# Patient Record
Sex: Male | Born: 1955
Health system: Southern US, Community
[De-identification: ages and names within clinical notes are randomized; demographics above are authoritative.]

## PROBLEM LIST (undated history)

## (undated) DIAGNOSIS — I10 Essential (primary) hypertension: Secondary | ICD-10-CM

## (undated) DIAGNOSIS — G4730 Sleep apnea, unspecified: Secondary | ICD-10-CM

## (undated) DIAGNOSIS — I447 Left bundle-branch block, unspecified: Secondary | ICD-10-CM

## (undated) DIAGNOSIS — I420 Dilated cardiomyopathy: Secondary | ICD-10-CM

## (undated) DIAGNOSIS — E7800 Pure hypercholesterolemia, unspecified: Secondary | ICD-10-CM

## (undated) HISTORY — DX: Pure hypercholesterolemia, unspecified: E78.00

## (undated) HISTORY — DX: Sleep apnea, unspecified: G47.30

## (undated) HISTORY — DX: Left bundle-branch block, unspecified: I44.7

## (undated) HISTORY — DX: Essential (primary) hypertension: I10

## (undated) HISTORY — DX: Dilated cardiomyopathy: I42.0

## (undated) HISTORY — PX: BACK SURGERY: SHX140

---

## 1998-02-21 ENCOUNTER — Encounter: Admission: RE | Admit: 1998-02-21 | Discharge: 1998-02-21 | Payer: Self-pay | Admitting: *Deleted

## 2003-08-04 ENCOUNTER — Emergency Department (HOSPITAL_COMMUNITY): Admission: AD | Admit: 2003-08-04 | Discharge: 2003-08-04 | Payer: Self-pay | Admitting: Family Medicine

## 2003-10-04 ENCOUNTER — Encounter: Admission: RE | Admit: 2003-10-04 | Discharge: 2003-10-04 | Payer: Self-pay | Admitting: Otolaryngology

## 2003-11-04 ENCOUNTER — Ambulatory Visit (HOSPITAL_BASED_OUTPATIENT_CLINIC_OR_DEPARTMENT_OTHER): Admission: RE | Admit: 2003-11-04 | Discharge: 2003-11-04 | Payer: Self-pay | Admitting: Otolaryngology

## 2003-12-25 ENCOUNTER — Ambulatory Visit (HOSPITAL_BASED_OUTPATIENT_CLINIC_OR_DEPARTMENT_OTHER): Admission: RE | Admit: 2003-12-25 | Discharge: 2003-12-25 | Payer: Self-pay | Admitting: Otolaryngology

## 2005-07-21 ENCOUNTER — Emergency Department (HOSPITAL_COMMUNITY): Admission: EM | Admit: 2005-07-21 | Discharge: 2005-07-21 | Payer: Self-pay | Admitting: Emergency Medicine

## 2011-01-14 ENCOUNTER — Encounter: Payer: Self-pay | Admitting: *Deleted

## 2011-01-14 DIAGNOSIS — I447 Left bundle-branch block, unspecified: Secondary | ICD-10-CM

## 2011-01-14 DIAGNOSIS — I10 Essential (primary) hypertension: Secondary | ICD-10-CM | POA: Insufficient documentation

## 2011-01-14 DIAGNOSIS — E119 Type 2 diabetes mellitus without complications: Secondary | ICD-10-CM | POA: Insufficient documentation

## 2011-01-14 DIAGNOSIS — E7800 Pure hypercholesterolemia, unspecified: Secondary | ICD-10-CM

## 2011-01-23 ENCOUNTER — Encounter: Payer: Self-pay | Admitting: Cardiology

## 2011-01-23 ENCOUNTER — Ambulatory Visit (INDEPENDENT_AMBULATORY_CARE_PROVIDER_SITE_OTHER): Payer: BC Managed Care – PPO | Admitting: Cardiology

## 2011-01-23 DIAGNOSIS — E7800 Pure hypercholesterolemia, unspecified: Secondary | ICD-10-CM

## 2011-01-23 DIAGNOSIS — I420 Dilated cardiomyopathy: Secondary | ICD-10-CM | POA: Insufficient documentation

## 2011-01-23 DIAGNOSIS — I10 Essential (primary) hypertension: Secondary | ICD-10-CM

## 2011-01-23 DIAGNOSIS — I428 Other cardiomyopathies: Secondary | ICD-10-CM

## 2011-01-23 NOTE — Assessment & Plan Note (Signed)
Blood pressure control is excellent. 

## 2011-01-23 NOTE — Progress Notes (Signed)
Kateri Plummer Date of Birth: 07-06-1955   History of Present Illness: Mr. Banfill is seen today for yearly followup. He he reports that he has done very well this past year. He denies any symptoms of palpitations, dizziness, chest pain, shortness of breath. He reports his blood sugars have been doing well. He remains active.  Current Outpatient Prescriptions on File Prior to Visit  Medication Sig Dispense Refill  . aspirin 325 MG tablet Take 81 mg by mouth daily.       . carvedilol (COREG) 6.25 MG tablet Take 6.25 mg by mouth 2 (two) times daily with a meal.        . fenofibrate (TRICOR) 145 MG tablet Take 145 mg by mouth daily.        Marland Kitchen losartan (COZAAR) 50 MG tablet Take 50 mg by mouth daily.        . multivitamin (THERAGRAN) per tablet Take 1 tablet by mouth daily.        . Omega-3 Fatty Acids (FISH OIL PO) Take 1 capsule by mouth daily.        . Sildenafil Citrate (VIAGRA PO) Take by mouth as needed.        Marland Kitchen SIMVASTATIN PO Take 1 tablet by mouth daily.          No Known Allergies  Past Medical History  Diagnosis Date  . Hypertension   . Diabetes mellitus     type 2  . Nonischemic dilated cardiomyopathy     Est. EF of 40-45%  . LBBB (left bundle branch block)   . Hypercholesterolemia   . Sleep apnea     Past Surgical History  Procedure Date  . Back surgery     History  Smoking status  . Never Smoker   Smokeless tobacco  . Never Used    History  Alcohol Use No    Family History  Problem Relation Age of Onset  . Diabetes Father   . Hypertension Father     ?  Marland Kitchen Cancer Father     kidney  . Hypertension Mother   . Stroke Mother   . Transient ischemic attack Mother     multiple  . Diabetes Brother   . Anorexia nervosa Sister   . Depression Sister   . Hyperlipidemia Sister     Review of Systems: The review of systems is positive for Cataracts.  He is scheduled for surgery in the near future.All other systems were reviewed and are negative.  Physical  Exam: BP 110/60  Pulse 68  Ht 6\' 2"  (1.88 m)  Wt 207 lb (93.895 kg)  BMI 26.58 kg/m2 The patient is alert and oriented x 3.  The mood and affect are normal.  The skin is warm and dry.  Color is normal.  The HEENT exam reveals that the sclera are nonicteric.  The mucous membranes are moist.  The carotids are 2+ without bruits.  There is no thyromegaly.  There is no JVD.  The lungs are clear.  The chest wall is non tender.  The heart exam reveals a regular rate with a normal S1 and S2.  There are no murmurs, gallops, or rubs.  The PMI is not displaced.   Abdominal exam reveals good bowel sounds.  There is no guarding or rebound.  There is no hepatosplenomegaly or tenderness.  There are no masses.  Exam of the legs reveal no clubbing, cyanosis, or edema.  The legs are without rashes.  The distal pulses are  intact.  Cranial nerves II - XII are intact.  Motor and sensory functions are intact.  The gait is normal.  LABORATORY DATA:   Assessment / Plan:

## 2011-01-23 NOTE — Patient Instructions (Signed)
Continue your current medications.  I will see you again in 1 year.   

## 2011-01-23 NOTE — Assessment & Plan Note (Signed)
Continue therapy with simvastatin, fenofibrate, and fish oil.

## 2011-01-23 NOTE — Assessment & Plan Note (Signed)
He is asymptomatic. He is on appropriate therapy with an ARB and carvedilol. He has no evidence of volume overload. We will continue with his current therapy and followup again in one year.

## 2013-02-08 ENCOUNTER — Ambulatory Visit (INDEPENDENT_AMBULATORY_CARE_PROVIDER_SITE_OTHER): Payer: BC Managed Care – PPO

## 2013-02-08 VITALS — Temp 98.0°F

## 2013-02-08 DIAGNOSIS — Z23 Encounter for immunization: Secondary | ICD-10-CM

## 2015-08-20 DIAGNOSIS — R21 Rash and other nonspecific skin eruption: Secondary | ICD-10-CM | POA: Diagnosis not present

## 2015-08-20 DIAGNOSIS — J069 Acute upper respiratory infection, unspecified: Secondary | ICD-10-CM | POA: Diagnosis not present

## 2015-08-20 DIAGNOSIS — S30861A Insect bite (nonvenomous) of abdominal wall, initial encounter: Secondary | ICD-10-CM | POA: Diagnosis not present

## 2015-08-20 DIAGNOSIS — S30860A Insect bite (nonvenomous) of lower back and pelvis, initial encounter: Secondary | ICD-10-CM | POA: Diagnosis not present

## 2015-09-25 DIAGNOSIS — X32XXXA Exposure to sunlight, initial encounter: Secondary | ICD-10-CM | POA: Diagnosis not present

## 2015-09-25 DIAGNOSIS — Z1283 Encounter for screening for malignant neoplasm of skin: Secondary | ICD-10-CM | POA: Diagnosis not present

## 2015-09-25 DIAGNOSIS — L570 Actinic keratosis: Secondary | ICD-10-CM | POA: Diagnosis not present

## 2015-09-25 DIAGNOSIS — T7840XA Allergy, unspecified, initial encounter: Secondary | ICD-10-CM | POA: Diagnosis not present

## 2016-02-19 DIAGNOSIS — E119 Type 2 diabetes mellitus without complications: Secondary | ICD-10-CM | POA: Diagnosis not present

## 2016-02-19 DIAGNOSIS — H5203 Hypermetropia, bilateral: Secondary | ICD-10-CM | POA: Diagnosis not present

## 2016-02-26 DIAGNOSIS — Z23 Encounter for immunization: Secondary | ICD-10-CM | POA: Diagnosis not present

## 2016-04-08 DIAGNOSIS — L570 Actinic keratosis: Secondary | ICD-10-CM | POA: Diagnosis not present

## 2016-04-08 DIAGNOSIS — X32XXXD Exposure to sunlight, subsequent encounter: Secondary | ICD-10-CM | POA: Diagnosis not present

## 2016-06-19 DIAGNOSIS — N529 Male erectile dysfunction, unspecified: Secondary | ICD-10-CM | POA: Diagnosis not present

## 2016-06-19 DIAGNOSIS — E1165 Type 2 diabetes mellitus with hyperglycemia: Secondary | ICD-10-CM | POA: Diagnosis not present

## 2016-06-19 DIAGNOSIS — E782 Mixed hyperlipidemia: Secondary | ICD-10-CM | POA: Diagnosis not present

## 2016-06-19 DIAGNOSIS — Z0000 Encounter for general adult medical examination without abnormal findings: Secondary | ICD-10-CM | POA: Diagnosis not present

## 2016-06-19 DIAGNOSIS — I10 Essential (primary) hypertension: Secondary | ICD-10-CM | POA: Diagnosis not present

## 2016-06-19 DIAGNOSIS — E119 Type 2 diabetes mellitus without complications: Secondary | ICD-10-CM | POA: Diagnosis not present

## 2017-02-22 DIAGNOSIS — E119 Type 2 diabetes mellitus without complications: Secondary | ICD-10-CM | POA: Diagnosis not present

## 2017-02-22 DIAGNOSIS — H25811 Combined forms of age-related cataract, right eye: Secondary | ICD-10-CM | POA: Diagnosis not present

## 2017-02-26 DIAGNOSIS — M25511 Pain in right shoulder: Secondary | ICD-10-CM | POA: Diagnosis not present

## 2017-02-26 DIAGNOSIS — Z23 Encounter for immunization: Secondary | ICD-10-CM | POA: Diagnosis not present

## 2017-02-26 DIAGNOSIS — M545 Low back pain: Secondary | ICD-10-CM | POA: Diagnosis not present

## 2017-02-26 DIAGNOSIS — Z8051 Family history of malignant neoplasm of kidney: Secondary | ICD-10-CM | POA: Diagnosis not present

## 2017-03-26 ENCOUNTER — Other Ambulatory Visit: Payer: Self-pay | Admitting: Family Medicine

## 2017-03-26 DIAGNOSIS — Z8051 Family history of malignant neoplasm of kidney: Secondary | ICD-10-CM

## 2017-04-02 ENCOUNTER — Ambulatory Visit
Admission: RE | Admit: 2017-04-02 | Discharge: 2017-04-02 | Disposition: A | Discharge status: Home / Self Care | Payer: BLUE CROSS/BLUE SHIELD | Source: Ambulatory Visit | Attending: Family Medicine | Admitting: Family Medicine

## 2017-04-02 DIAGNOSIS — Z8051 Family history of malignant neoplasm of kidney: Secondary | ICD-10-CM

## 2017-04-02 DIAGNOSIS — K7689 Other specified diseases of liver: Secondary | ICD-10-CM | POA: Diagnosis not present

## 2017-04-02 MED ORDER — IOPAMIDOL (ISOVUE-300) INJECTION 61%
100.0000 mL | Freq: Once | INTRAVENOUS | Status: AC | PRN
  Administered 2017-04-02: 100 mL via INTRAVENOUS

## 2017-04-10 ENCOUNTER — Other Ambulatory Visit: Payer: Self-pay | Admitting: Family Medicine

## 2017-04-10 DIAGNOSIS — Z8051 Family history of malignant neoplasm of kidney: Secondary | ICD-10-CM

## 2017-04-28 ENCOUNTER — Ambulatory Visit
Admission: RE | Admit: 2017-04-28 | Discharge: 2017-04-28 | Disposition: A | Discharge status: Home / Self Care | Payer: BLUE CROSS/BLUE SHIELD | Source: Ambulatory Visit | Attending: Family Medicine | Admitting: Family Medicine

## 2017-04-28 DIAGNOSIS — N2889 Other specified disorders of kidney and ureter: Secondary | ICD-10-CM | POA: Diagnosis not present

## 2017-04-28 DIAGNOSIS — Z8051 Family history of malignant neoplasm of kidney: Secondary | ICD-10-CM

## 2017-04-28 MED ORDER — GADOBENATE DIMEGLUMINE 529 MG/ML IV SOLN
18.0000 mL | Freq: Once | INTRAVENOUS | Status: AC | PRN
  Administered 2017-04-28: 18 mL via INTRAVENOUS

## 2017-05-11 DIAGNOSIS — H25041 Posterior subcapsular polar age-related cataract, right eye: Secondary | ICD-10-CM | POA: Diagnosis not present

## 2017-05-11 DIAGNOSIS — H25011 Cortical age-related cataract, right eye: Secondary | ICD-10-CM | POA: Diagnosis not present

## 2017-05-11 DIAGNOSIS — H18413 Arcus senilis, bilateral: Secondary | ICD-10-CM | POA: Diagnosis not present

## 2017-05-11 DIAGNOSIS — H2511 Age-related nuclear cataract, right eye: Secondary | ICD-10-CM | POA: Diagnosis not present

## 2017-06-24 DIAGNOSIS — E782 Mixed hyperlipidemia: Secondary | ICD-10-CM | POA: Diagnosis not present

## 2017-06-24 DIAGNOSIS — I10 Essential (primary) hypertension: Secondary | ICD-10-CM | POA: Diagnosis not present

## 2017-06-24 DIAGNOSIS — Z125 Encounter for screening for malignant neoplasm of prostate: Secondary | ICD-10-CM | POA: Diagnosis not present

## 2017-06-24 DIAGNOSIS — E119 Type 2 diabetes mellitus without complications: Secondary | ICD-10-CM | POA: Diagnosis not present

## 2017-06-24 DIAGNOSIS — Z0000 Encounter for general adult medical examination without abnormal findings: Secondary | ICD-10-CM | POA: Diagnosis not present

## 2017-06-28 DIAGNOSIS — H2511 Age-related nuclear cataract, right eye: Secondary | ICD-10-CM | POA: Diagnosis not present

## 2017-06-28 DIAGNOSIS — H25811 Combined forms of age-related cataract, right eye: Secondary | ICD-10-CM | POA: Diagnosis not present

## 2018-03-29 DIAGNOSIS — Z23 Encounter for immunization: Secondary | ICD-10-CM | POA: Diagnosis not present

## 2018-07-05 DIAGNOSIS — E1122 Type 2 diabetes mellitus with diabetic chronic kidney disease: Secondary | ICD-10-CM | POA: Diagnosis not present

## 2018-07-05 DIAGNOSIS — E782 Mixed hyperlipidemia: Secondary | ICD-10-CM | POA: Diagnosis not present

## 2018-07-05 DIAGNOSIS — N183 Chronic kidney disease, stage 3 (moderate): Secondary | ICD-10-CM | POA: Diagnosis not present

## 2018-07-05 DIAGNOSIS — Z125 Encounter for screening for malignant neoplasm of prostate: Secondary | ICD-10-CM | POA: Diagnosis not present

## 2018-07-05 DIAGNOSIS — Z0000 Encounter for general adult medical examination without abnormal findings: Secondary | ICD-10-CM | POA: Diagnosis not present

## 2018-07-05 DIAGNOSIS — I10 Essential (primary) hypertension: Secondary | ICD-10-CM | POA: Diagnosis not present

## 2018-09-29 ENCOUNTER — Other Ambulatory Visit: Payer: Self-pay

## 2018-09-29 ENCOUNTER — Ambulatory Visit: Payer: PPO | Admitting: Podiatry

## 2018-09-29 ENCOUNTER — Encounter: Payer: Self-pay | Admitting: Podiatry

## 2018-09-29 VITALS — BP 104/53 | Temp 98.4°F

## 2018-09-29 DIAGNOSIS — L600 Ingrowing nail: Secondary | ICD-10-CM | POA: Diagnosis not present

## 2018-09-29 MED ORDER — NEOMYCIN-POLYMYXIN-HC 3.5-10000-1 OT SOLN: OTIC | 0 refills | Status: DC

## 2018-09-29 MED ORDER — NEOMYCIN-POLYMYXIN-HC 3.5-10000-1 OT SOLN
OTIC | 1 refills | Status: DC
Start: 2018-09-29 — End: 2019-08-10

## 2018-09-29 NOTE — Patient Instructions (Signed)

## 2018-09-30 NOTE — Progress Notes (Signed)
Subjective:   Patient ID: Jeffery Stone, male   DOB: 63 y.o.   MRN: [id number]   HPI Patient presents with chronic ingrown toenail deformities of both feet stating that been there for years and he is tried to trim them and soak them without relief.  Patient points to 2 borders on the left and the medial border on the right and states they are increasingly sore and he would like them taken care of permanently.  Patient does not smoke and does like to be active   Review of Systems  All other systems reviewed and are negative.       Objective:  Physical Exam Vitals signs and nursing note reviewed.  Constitutional:      Appearance: He is well-developed.  Pulmonary:     Effort: Pulmonary effort is normal.  Musculoskeletal: Normal range of motion.  Skin:    General: Skin is warm.  Neurological:     Mental Status: He is alert.     Neurovascular status intact muscle strength is adequate range of motion within normal limits.  Patient is found to have incurvated left hallux medial lateral borders that are sore when palpated and on the right hallux medial border it is painful when pressed.  There is no drainage noted in light redness but no indication of infective process just pain with structural deformity of the nailbeds.  Patient does have good digital perfusion and is well oriented x3     Assessment:  Chronic ingrown toenail deformity hallux bilateral medial lateral left medial border on the right with pain     Plan:  A H&P conditions reviewed and recommended removal of the nail borders.  I explained the procedure risk and patient signed consent form after review.  Today I infiltrated each hallux 60 mg like Marcaine mixture sterile prep applied and using sterile instrumentation I remove the medial border the right hallux and the medial lateral border the left hallux exposed matrix and applied phenol 3 applications 30 seconds followed by out dressing.  Gave instructions on soaks and drops and  instructed on leaving dressings on 24 hours but removing them early if throbbing not to occur and encouraged to call with questions concerns signed visit

## 2018-10-13 DIAGNOSIS — H26492 Other secondary cataract, left eye: Secondary | ICD-10-CM | POA: Diagnosis not present

## 2018-10-13 DIAGNOSIS — H1045 Other chronic allergic conjunctivitis: Secondary | ICD-10-CM | POA: Diagnosis not present

## 2018-10-13 DIAGNOSIS — E119 Type 2 diabetes mellitus without complications: Secondary | ICD-10-CM | POA: Diagnosis not present

## 2019-02-16 ENCOUNTER — Other Ambulatory Visit: Payer: Self-pay | Admitting: Family Medicine

## 2019-02-16 DIAGNOSIS — R1909 Other intra-abdominal and pelvic swelling, mass and lump: Secondary | ICD-10-CM | POA: Diagnosis not present

## 2019-02-16 DIAGNOSIS — Z23 Encounter for immunization: Secondary | ICD-10-CM | POA: Diagnosis not present

## 2019-02-17 ENCOUNTER — Ambulatory Visit (INDEPENDENT_AMBULATORY_CARE_PROVIDER_SITE_OTHER): Payer: PPO

## 2019-02-17 ENCOUNTER — Other Ambulatory Visit: Payer: Self-pay

## 2019-02-17 DIAGNOSIS — R1909 Other intra-abdominal and pelvic swelling, mass and lump: Secondary | ICD-10-CM

## 2019-03-07 DIAGNOSIS — R590 Localized enlarged lymph nodes: Secondary | ICD-10-CM | POA: Diagnosis not present

## 2019-03-14 DIAGNOSIS — S80869A Insect bite (nonvenomous), unspecified lower leg, initial encounter: Secondary | ICD-10-CM | POA: Diagnosis not present

## 2019-03-14 DIAGNOSIS — W57XXXA Bitten or stung by nonvenomous insect and other nonvenomous arthropods, initial encounter: Secondary | ICD-10-CM | POA: Diagnosis not present

## 2019-03-17 DIAGNOSIS — E782 Mixed hyperlipidemia: Secondary | ICD-10-CM | POA: Diagnosis not present

## 2019-03-17 DIAGNOSIS — I10 Essential (primary) hypertension: Secondary | ICD-10-CM | POA: Diagnosis not present

## 2019-03-17 DIAGNOSIS — N183 Chronic kidney disease, stage 3 unspecified: Secondary | ICD-10-CM | POA: Diagnosis not present

## 2019-03-17 DIAGNOSIS — E1122 Type 2 diabetes mellitus with diabetic chronic kidney disease: Secondary | ICD-10-CM | POA: Diagnosis not present

## 2019-04-07 DIAGNOSIS — E1122 Type 2 diabetes mellitus with diabetic chronic kidney disease: Secondary | ICD-10-CM | POA: Diagnosis not present

## 2019-04-07 DIAGNOSIS — I10 Essential (primary) hypertension: Secondary | ICD-10-CM | POA: Diagnosis not present

## 2019-04-07 DIAGNOSIS — E782 Mixed hyperlipidemia: Secondary | ICD-10-CM | POA: Diagnosis not present

## 2019-06-12 DIAGNOSIS — I10 Essential (primary) hypertension: Secondary | ICD-10-CM | POA: Diagnosis not present

## 2019-06-12 DIAGNOSIS — E782 Mixed hyperlipidemia: Secondary | ICD-10-CM | POA: Diagnosis not present

## 2019-06-12 DIAGNOSIS — E1122 Type 2 diabetes mellitus with diabetic chronic kidney disease: Secondary | ICD-10-CM | POA: Diagnosis not present

## 2019-07-06 ENCOUNTER — Emergency Department (HOSPITAL_COMMUNITY)
Admission: EM | Admit: 2019-07-06 | Discharge: 2019-07-06 | Disposition: A | Discharge status: Home / Self Care | Payer: PPO | Attending: Emergency Medicine | Admitting: Emergency Medicine

## 2019-07-06 ENCOUNTER — Other Ambulatory Visit: Payer: Self-pay

## 2019-07-06 ENCOUNTER — Encounter (HOSPITAL_COMMUNITY): Payer: Self-pay | Admitting: Pediatrics

## 2019-07-06 ENCOUNTER — Emergency Department (HOSPITAL_COMMUNITY): Payer: PPO

## 2019-07-06 DIAGNOSIS — M7918 Myalgia, other site: Secondary | ICD-10-CM | POA: Diagnosis not present

## 2019-07-06 DIAGNOSIS — M79651 Pain in right thigh: Secondary | ICD-10-CM | POA: Diagnosis not present

## 2019-07-06 DIAGNOSIS — Z125 Encounter for screening for malignant neoplasm of prostate: Secondary | ICD-10-CM | POA: Diagnosis not present

## 2019-07-06 DIAGNOSIS — Z0000 Encounter for general adult medical examination without abnormal findings: Secondary | ICD-10-CM | POA: Diagnosis not present

## 2019-07-06 DIAGNOSIS — Z79899 Other long term (current) drug therapy: Secondary | ICD-10-CM | POA: Insufficient documentation

## 2019-07-06 DIAGNOSIS — Z8679 Personal history of other diseases of the circulatory system: Secondary | ICD-10-CM | POA: Diagnosis not present

## 2019-07-06 DIAGNOSIS — G4733 Obstructive sleep apnea (adult) (pediatric): Secondary | ICD-10-CM | POA: Diagnosis not present

## 2019-07-06 DIAGNOSIS — E782 Mixed hyperlipidemia: Secondary | ICD-10-CM | POA: Diagnosis not present

## 2019-07-06 DIAGNOSIS — E119 Type 2 diabetes mellitus without complications: Secondary | ICD-10-CM | POA: Diagnosis not present

## 2019-07-06 DIAGNOSIS — I10 Essential (primary) hypertension: Secondary | ICD-10-CM | POA: Diagnosis not present

## 2019-07-06 DIAGNOSIS — M79652 Pain in left thigh: Secondary | ICD-10-CM | POA: Insufficient documentation

## 2019-07-06 DIAGNOSIS — R0781 Pleurodynia: Secondary | ICD-10-CM | POA: Diagnosis not present

## 2019-07-06 DIAGNOSIS — M791 Myalgia, unspecified site: Secondary | ICD-10-CM

## 2019-07-06 DIAGNOSIS — I447 Left bundle-branch block, unspecified: Secondary | ICD-10-CM | POA: Diagnosis not present

## 2019-07-06 DIAGNOSIS — E1122 Type 2 diabetes mellitus with diabetic chronic kidney disease: Secondary | ICD-10-CM | POA: Diagnosis not present

## 2019-07-06 DIAGNOSIS — Z7982 Long term (current) use of aspirin: Secondary | ICD-10-CM | POA: Insufficient documentation

## 2019-07-06 DIAGNOSIS — N183 Chronic kidney disease, stage 3 unspecified: Secondary | ICD-10-CM | POA: Diagnosis not present

## 2019-07-06 DIAGNOSIS — R0789 Other chest pain: Secondary | ICD-10-CM | POA: Diagnosis not present

## 2019-07-06 DIAGNOSIS — I700 Atherosclerosis of aorta: Secondary | ICD-10-CM | POA: Diagnosis not present

## 2019-07-06 DIAGNOSIS — R350 Frequency of micturition: Secondary | ICD-10-CM | POA: Diagnosis not present

## 2019-07-06 LAB — TROPONIN I (HIGH SENSITIVITY)
Troponin I (High Sensitivity): 3 ng/L (ref <18)
Troponin I (High Sensitivity): 3 ng/L (ref <18)

## 2019-07-06 LAB — BASIC METABOLIC PANEL
Anion gap: 10 (ref 5–15)
BUN: 33 mg/dL — ABNORMAL HIGH (ref 8–23)
CO2: 25 mmol/L (ref 22–32)
Calcium: 9.3 mg/dL (ref 8.9–10.3)
Chloride: 106 mmol/L (ref 98–111)
Creatinine, Ser: 1.47 mg/dL — ABNORMAL HIGH (ref 0.61–1.24)
GFR calc Af Amer: 57 mL/min — ABNORMAL LOW (ref >60)
GFR calc non Af Amer: 49 mL/min — ABNORMAL LOW (ref >60)
Glucose, Bld: 89 mg/dL (ref 70–99)
Potassium: 4.2 mmol/L (ref 3.5–5.1)
Sodium: 141 mmol/L (ref 135–145)

## 2019-07-06 LAB — CBC
HCT: 35.4 % — ABNORMAL LOW (ref 39.0–52.0)
Hemoglobin: 12 g/dL — ABNORMAL LOW (ref 13.0–17.0)
MCH: 31.2 pg (ref 26.0–34.0)
MCHC: 33.9 g/dL (ref 30.0–36.0)
MCV: 91.9 fL (ref 80.0–100.0)
Platelets: 250 10*3/uL (ref 150–400)
RBC: 3.85 MIL/uL — ABNORMAL LOW (ref 4.22–5.81)
RDW: 13.4 % (ref 11.5–15.5)
WBC: 6.5 10*3/uL (ref 4.0–10.5)
nRBC: 0 % (ref 0.0–0.2)

## 2019-07-06 MED ORDER — SODIUM CHLORIDE 0.9% FLUSH: 3.0000 mL | Freq: Once | INTRAVENOUS | Status: DC

## 2019-07-06 NOTE — ED Provider Notes (Signed)
[name] Provider Note   CSN: [id number] Arrival date & time: 07/06/19  1814     History Chief Complaint  Patient presents with  . Chest Pain  . Leg Pain    Jeffery Stone is a 64 y.o. male.  64 yo M with a chief complaints of myalgias.  Worse to his upper thighs and his hamstrings bilaterally.  Going on for about a year or so worsening over the past few months.  Has seen his family doctor.  They were concerned this was a statin induced myopathy and have decreased his statin.  Has continued to have symptoms.  Was seen back in the doctor's office and noticed that his CPK was elevated and was sent to the ED for evaluation.  States he is also had some left torso pain.  Feels like a spasm-like in his legs.  Denies trauma to the area.  Worse with twisting and turning.  The history is provided by the patient.  Chest Pain Pain location:  L lateral chest Pain quality: aching   Pain radiates to:  Does not radiate Pain severity:  Moderate Onset quality:  Gradual Duration:  2 days Timing:  Constant Progression:  Worsening Chronicity:  New Relieved by:  Nothing Worsened by:  Movement and certain positions Ineffective treatments:  None tried Associated symptoms: no abdominal pain, no fever, no headache, no palpitations, no shortness of breath and no vomiting   Leg Pain Associated symptoms: no fever        Past Medical History:  Diagnosis Date  . Diabetes mellitus    type 2  . Hypercholesterolemia   . Hypertension   . LBBB (left bundle branch block)   . Nonischemic dilated cardiomyopathy (HCC)    Est. EF of 40-45%  . Sleep apnea     Patient Active Problem List   Diagnosis Date Noted  . Nonischemic dilated cardiomyopathy ([place])   . Left bundle branch block 01/14/2011  . Hypertension 01/14/2011  . Hypercholesterolemia 01/14/2011  . Diabetes mellitus 01/14/2011    Past Surgical History:  Procedure Laterality Date  . BACK SURGERY          Family History  Problem Relation Age of Onset  . Diabetes Father   . Hypertension Father        ?  Marland Kitchen Cancer Father        kidney  . Hypertension Mother   . Stroke Mother   . Transient ischemic attack Mother        multiple  . Diabetes Brother   . Anorexia nervosa Sister   . Depression Sister   . Hyperlipidemia Sister     Social History   Tobacco Use  . Smoking status: Never Smoker  . Smokeless tobacco: Never Used  Substance Use Topics  . Alcohol use: No  . Drug use: No    Home Medications Prior to Admission medications   Medication Sig Start Date End Date Taking? Authorizing Provider  aspirin 325 MG tablet Take 81 mg by mouth daily.     [provider]  Bayer Microlet Lancets lancets USE TO TEST BLOOD SUGAR ONCE DAILY E11.9 05/23/18   [provider]  carvedilol (COREG) 6.25 MG tablet Take 6.25 mg by mouth 2 (two) times daily with a meal.      [provider]  fenofibrate (TRICOR) 145 MG tablet Take 145 mg by mouth daily.      [provider]  losartan (COZAAR) 100 MG tablet Take  50 mg by mouth daily. 05/28/18   [provider]  losartan (COZAAR) 50 MG tablet Take 50 mg by mouth daily.      [provider]  multivitamin [facility]) per tablet Take 1 tablet by mouth daily.      [provider]  neomycin-polymyxin-hydrocortisone (CORTISPORIN) OTIC solution Apply 1-2 drops to toe after soaking BID 09/29/18   Wallene Huh, DPM  neomycin-polymyxin-hydrocortisone (CORTISPORIN) OTIC solution Apply 1-2 drops to toe after soaking twice a day 09/29/18   Wallene Huh, DPM  Omega-3 Fatty Acids (FISH OIL PO) Take 1 capsule by mouth daily.      [provider]  sildenafil (VIAGRA) 100 MG tablet TAKE 1/2 TABLET ONCE DAILY IF NEEDED 05/28/18   [provider]  Sildenafil Citrate (VIAGRA PO) Take by mouth as needed.      [provider]  simvastatin (ZOCOR) 40 MG tablet Take 40 mg by mouth  daily. 07/01/18   [provider]  SIMVASTATIN PO Take 1 tablet by mouth daily.      [provider]    Allergies    Patient has no known allergies.  Review of Systems   Review of Systems  Constitutional: Negative for chills and fever.  HENT: Negative for congestion and facial swelling.   Eyes: Negative for discharge and visual disturbance.  Respiratory: Negative for shortness of breath.   Cardiovascular: Positive for chest pain. Negative for palpitations.  Gastrointestinal: Negative for abdominal pain, diarrhea and vomiting.  Musculoskeletal: Positive for arthralgias and myalgias.  Skin: Negative for color change and rash.  Neurological: Negative for tremors, syncope and headaches.  Psychiatric/Behavioral: Negative for confusion and dysphoric mood.    Physical Exam Updated Vital Signs BP 109/71   Pulse 67   Temp 97.8 F (36.6 C) (Oral)   Resp 17   Ht 6\' 2"  (1.88 m)   Wt 89.4 kg   SpO2 99%   BMI 25.29 kg/m   Physical Exam Vitals and nursing note reviewed.  Constitutional:      Appearance: He is well-developed.  HENT:     Head: Normocephalic and atraumatic.  Eyes:     Pupils: Pupils are equal, round, and reactive to light.  Neck:     Vascular: No JVD.  Cardiovascular:     Rate and Rhythm: Normal rate and regular rhythm.     Heart sounds: No murmur. No friction rub. No gallop.   Pulmonary:     Effort: No respiratory distress.     Breath sounds: No wheezing.  Abdominal:     General: There is no distension.     Tenderness: There is no guarding or rebound.  Musculoskeletal:        General: Normal range of motion.     Cervical back: Normal range of motion and neck supple.     Comments: Mild tenderness about the hamstrings bilaterally.  There is a small nodule along the left hamstring as it is approaching the pelvis.  Question lymph node.  No erythema nontender.  Skin:    Coloration: Skin is not pale.     Findings: No rash.  Neurological:      Mental Status: He is alert and oriented to person, place, and time.  Psychiatric:        Behavior: Behavior normal.     ED Results / Procedures / Treatments   Labs (all labs ordered are listed, but only abnormal results are displayed) Labs Reviewed  BASIC METABOLIC PANEL - Abnormal; Notable  for the following components:      Result Value   BUN 33 (*)    Creatinine, Ser 1.47 (*)    GFR calc non Af Amer 49 (*)    GFR calc Af Amer 57 (*)    All other components within normal limits  CBC - Abnormal; Notable for the following components:   RBC 3.85 (*)    Hemoglobin 12.0 (*)    HCT 35.4 (*)    All other components within normal limits  TROPONIN I (HIGH SENSITIVITY)  TROPONIN I (HIGH SENSITIVITY)    EKG EKG Interpretation  Date/Time:  Thursday July 06 2019 18:18:40 EST Ventricular Rate:  82 PR Interval:  140 QRS Duration: 146 QT Interval:  426 QTC Calculation: 497 R Axis:   46 Text Interpretation: Normal sinus rhythm Left bundle branch block Abnormal ECG No significant change since last tracing Confirmed by Deno Etienne [phone]) on 07/06/2019 8:40:16 PM   Radiology DG Chest 2 View  Result Date: 07/06/2019 CLINICAL DATA:  LEFT lower rib pain for several weeks. Recent COVID vaccination. EXAM: CHEST - 2 VIEW COMPARISON:  Chest x-ray dated 08/24/2013. FINDINGS: Heart size and mediastinal contours are within normal limits. Lungs are clear. No pleural effusion or pneumothorax is seen. Osseous structures about the chest are unremarkable. IMPRESSION: No active cardiopulmonary disease. No evidence of pneumonia. Electronically Signed   By: Franki Cabot M.D.   On: 07/06/2019 19:33    Procedures Procedures (including critical care time)  Medications Ordered in ED Medications  sodium chloride flush (NS) 0.9 % injection 3 mL (has no administration in time range)    ED Course  I have reviewed the triage vital signs and the nursing notes.  Pertinent labs & imaging results that were  available during my care of the patient were reviewed by me and considered in my medical decision making (see chart for details).    MDM Rules/Calculators/A&P                      64 yo M here with a chief complaints of myalgias.  Seen in the family doctor's office today and there is some concern for an elevated CPK.  The level that was reported is not high enough that I would consider to be rhabdomyolysis.  He is a well-appearing and active individual.  His delta troponin here is negative making that inconsistent with ACS. D/c home.   11:05 PM:  I have discussed the diagnosis/risks/treatment options with the patient and believe the pt to be eligible for discharge home to follow-up with PCP. We also discussed returning to the ED immediately if new or worsening sx occur. We discussed the sx which are most concerning (e.g., sudden worsening pain, fever, inability to tolerate by mouth) that necessitate immediate return. Medications administered to the patient during their visit and any new prescriptions provided to the patient are listed below.  Medications given during this visit Medications  sodium chloride flush (NS) 0.9 % injection 3 mL (has no administration in time range)     The patient appears reasonably screen and/or stabilized for discharge and I doubt any other medical condition or other [facility] requiring further screening, evaluation, or treatment in the ED at this time prior to discharge.    Final Clinical Impression(s) / ED Diagnoses Final diagnoses:  Myalgia    Rx / DC Orders ED Discharge Orders    None       Deno Etienne, DO 07/06/19 2306

## 2019-07-06 NOTE — ED Notes (Signed)
Called pt in lobby for vitals check , no response.

## 2019-07-06 NOTE — Discharge Instructions (Signed)
Based on the work-up here does not appear that you are having a heart attack.  Please follow-up with your family doctor.  Please asked them about your renal function that was found here today.

## 2019-07-06 NOTE — ED Triage Notes (Signed)
Came in POV; c/o rib and leg pain. Patient reported was sent for abnormal CPK value of 484. Pt endorsed history of left bundle branch block.

## 2019-07-19 DIAGNOSIS — R748 Abnormal levels of other serum enzymes: Secondary | ICD-10-CM | POA: Diagnosis not present

## 2019-07-26 DIAGNOSIS — D225 Melanocytic nevi of trunk: Secondary | ICD-10-CM | POA: Diagnosis not present

## 2019-07-26 DIAGNOSIS — Z1283 Encounter for screening for malignant neoplasm of skin: Secondary | ICD-10-CM | POA: Diagnosis not present

## 2019-08-07 DIAGNOSIS — E7800 Pure hypercholesterolemia, unspecified: Secondary | ICD-10-CM | POA: Diagnosis not present

## 2019-08-07 DIAGNOSIS — R748 Abnormal levels of other serum enzymes: Secondary | ICD-10-CM | POA: Diagnosis not present

## 2019-08-08 NOTE — Progress Notes (Signed)
Cardiology Office Note   Date:  08/10/2019   ID:  Jeffery Stone, DOB Sep 05, 1955, MRN [id number]  PCP:  Leighton Ruff, MD  Cardiologist:   Yisell Sprunger Martinique, MD   Chief Complaint  Patient presents with  . Chest Pain      History of Present Illness: Jeffery Stone is a 64 y.o. male who is seen at the request of Dr Drema Dallas for evaluation of cardiomyopathy and chest pain. He has a history of DM type 2, HLD, HTN and LBBB. He has a history of OSA. Aortic atherosclerosis noted on CT. He was last seen by me in 2012. Apparently has cardiomyopathy with EF 40-45%. Myoview perfusion study in 2006 showed no ischemia.   Recently he was seen for a routine physical. Noted some symptoms of chest pain around his ribs R>L. Noted more when doing yard work. On physical labs showed an Elevated CPK to 484. He was sent to the ED where troponin levels were normal x 2. Ecg showed his chronic LBBB. He states his CPK went down to 216 but then back up again. Simvastatin held. He comes of a knot in the area of his left upper thigh that comes and goes. Multiple scans of the area have been normal.     Past Medical History:  Diagnosis Date  . Diabetes mellitus    type 2  . Hypercholesterolemia   . Hypertension   . LBBB (left bundle branch block)   . Nonischemic dilated cardiomyopathy (HCC)    Est. EF of 40-45%  . Sleep apnea     Past Surgical History:  Procedure Laterality Date  . BACK SURGERY       Current Outpatient Medications  Medication Sig Dispense Refill  . aspirin EC 81 MG tablet Take 81 mg by mouth daily.    [name] Microlet Lancets lancets USE TO TEST BLOOD SUGAR ONCE DAILY E11.9    . carvedilol (COREG) 6.25 MG tablet Take 6.25 mg by mouth 2 (two) times daily with a meal.      . fenofibrate (TRICOR) 145 MG tablet Take 145 mg by mouth daily.      Marland Kitchen losartan (COZAAR) 100 MG tablet Take 50 mg by mouth daily.    Marland Kitchen losartan (COZAAR) 50 MG tablet Take 50 mg by mouth daily.      . multivitamin  (THERAGRAN) per tablet Take 1 tablet by mouth daily.      . Omega-3 Fatty Acids (FISH OIL PO) Take 1 capsule by mouth daily.      . sildenafil (VIAGRA) 100 MG tablet TAKE 1/2 TABLET ONCE DAILY IF NEEDED     No current facility-administered medications for this visit.    Allergies:   Patient has no known allergies.    Social History:  The patient  reports that he has never smoked. He has never used smokeless tobacco. He reports that he does not drink alcohol or use drugs.   Family History:  The patient's family history includes Anorexia nervosa in his sister; Cancer in his father; Depression in his sister; Diabetes in his brother and father; Hyperlipidemia in his sister; Hypertension in his father and mother; Stroke in his mother; Transient ischemic attack in his mother.    ROS:  Please see the history of present illness.   Otherwise, review of systems are positive for none.   All other systems are reviewed and negative.    PHYSICAL EXAM: VS:  BP 102/62   Pulse 77   Ht 6\' 2"  (  1.88 m)   Wt 189 lb (85.7 kg)   SpO2 99%   BMI 24.27 kg/m  , BMI Body mass index is 24.27 kg/m. GEN: Well nourished, well developed, in no acute distress  HEENT: normal  Neck: no JVD, carotid bruits, or masses Cardiac: RRR; no murmurs, rubs, or gallops,no edema  Respiratory:  clear to auscultation bilaterally, normal work of breathing GI: soft, nontender, nondistended, + BS MS: no deformity or atrophy  Skin: warm and dry, no rash Neuro:  Strength and sensation are intact Psych: euthymic mood, full affect   EKG:  EKG is not ordered today. The ekg ordered 07/06/19 demonstrates NSR with LBBB. I have personally reviewed and interpreted this study.    Recent Labs: 07/06/2019: BUN 33; Creatinine, Ser 1.47; Hemoglobin 12.0; Platelets 250; Potassium 4.2; Sodium 141    Lipid Panel No results found for: CHOL, TRIG, HDL, CHOLHDL, VLDL, LDLCALC, LDLDIRECT   Dated 07/06/19: cholesterol 136, triglycerides 81,  HDL 32, LDL 71. A1c 6%. Creatinine 1.2.. Hgb 12. Otherwise CMET and CBC normal.    Wt Readings from Last 3 Encounters:  08/10/19 189 lb (85.7 kg)  07/06/19 197 lb (89.4 kg)  01/23/11 207 lb (93.9 kg)      Other studies Reviewed: Additional studies/ records that were reviewed today include: see above   ASSESSMENT AND PLAN:  1.  Atypical chest pain. Symptoms sound more musculoskeletal but patient does have multiple cardiac risk factors including DM, HLD, HTN and aortic atherosclerosis. I have recommended checking a Lexiscan myoview study.  2. History of nonischemic CM with EF 40-45%. Asymptomatic. On ARB and Coreg. I suspect this is at least partly related to dyssynchrony with his LBBB. Will update Echo 3. Elevated CPK. Not cardiac. Suspect related to statin. If does not improve with cessation of statin may need to consider further muscle assessment 4. LBBB 5. DM type 2 6. HTN 7. HLD   Current medicines are reviewed at length with the patient today.  The patient has no concerns regarding medicines.  The following changes have been made:  no change  Labs/ tests ordered today include:   Orders Placed This Encounter  Procedures  . Myocardial Perfusion Imaging  . ECHOCARDIOGRAM COMPLETE     Disposition:   FU with me TBD based on above tests.   Signed, Kaari Zeigler Martin[place], MD  08/10/2019 1:00 PM    [facility] [address] [place], [place], [address] Phone [phone], Fax [phone]

## 2019-08-10 ENCOUNTER — Encounter: Payer: Self-pay | Admitting: Cardiology

## 2019-08-10 ENCOUNTER — Other Ambulatory Visit: Payer: Self-pay

## 2019-08-10 ENCOUNTER — Ambulatory Visit: Payer: PPO | Admitting: Cardiology

## 2019-08-10 VITALS — BP 102/62 | HR 77 | Ht 74.0 in | Wt 189.0 lb

## 2019-08-10 DIAGNOSIS — I447 Left bundle-branch block, unspecified: Secondary | ICD-10-CM

## 2019-08-10 DIAGNOSIS — I10 Essential (primary) hypertension: Secondary | ICD-10-CM | POA: Diagnosis not present

## 2019-08-10 DIAGNOSIS — E785 Hyperlipidemia, unspecified: Secondary | ICD-10-CM | POA: Diagnosis not present

## 2019-08-10 DIAGNOSIS — N182 Chronic kidney disease, stage 2 (mild): Secondary | ICD-10-CM | POA: Diagnosis not present

## 2019-08-10 DIAGNOSIS — R0789 Other chest pain: Secondary | ICD-10-CM | POA: Diagnosis not present

## 2019-08-10 DIAGNOSIS — I420 Dilated cardiomyopathy: Secondary | ICD-10-CM

## 2019-08-10 DIAGNOSIS — E1122 Type 2 diabetes mellitus with diabetic chronic kidney disease: Secondary | ICD-10-CM

## 2019-08-10 NOTE — Patient Instructions (Signed)
Medication Instructions:  Continue same medications    Lab Work: None ordered    Testing/Procedures: Schedule Lexiscan Myoview Schedule Echo   Follow-Up: At [facility], you and your health needs are our priority.  As part of our continuing mission to provide you with exceptional heart care, we have created designated Provider Care Teams.  These Care Teams include your primary Cardiologist (physician) and Advanced Practice Providers (APPs -  Physician Assistants and Nurse Practitioners) who all work together to provide you with the care you need, when you need it.  We recommend signing up for the patient portal called "MyChart".  Sign up information is provided on this After Visit Summary.  MyChart is used to connect with patients for Virtual Visits (Telemedicine).  Patients are able to view lab/test results, encounter notes, upcoming appointments, etc.  Non-urgent messages can be sent to your provider as well.   To learn more about what you can do with MyChart, go to [email or web address].    Your next appointment:  Will be determined after test   The format for your next appointment: Office     Provider:  Dr.Jordan

## 2019-08-11 ENCOUNTER — Telehealth (HOSPITAL_COMMUNITY): Payer: Self-pay

## 2019-08-11 NOTE — Telephone Encounter (Signed)
Encounter complete. 

## 2019-08-16 ENCOUNTER — Ambulatory Visit (HOSPITAL_COMMUNITY)
Admission: RE | Admit: 2019-08-16 | Discharge: 2019-08-16 | Disposition: A | Discharge status: Home / Self Care | Payer: PPO | Source: Ambulatory Visit | Attending: Cardiology | Admitting: Cardiology

## 2019-08-16 ENCOUNTER — Other Ambulatory Visit: Payer: Self-pay

## 2019-08-16 DIAGNOSIS — I10 Essential (primary) hypertension: Secondary | ICD-10-CM | POA: Insufficient documentation

## 2019-08-16 DIAGNOSIS — E1122 Type 2 diabetes mellitus with diabetic chronic kidney disease: Secondary | ICD-10-CM

## 2019-08-16 DIAGNOSIS — I447 Left bundle-branch block, unspecified: Secondary | ICD-10-CM | POA: Diagnosis not present

## 2019-08-16 DIAGNOSIS — N182 Chronic kidney disease, stage 2 (mild): Secondary | ICD-10-CM | POA: Diagnosis not present

## 2019-08-16 DIAGNOSIS — E785 Hyperlipidemia, unspecified: Secondary | ICD-10-CM

## 2019-08-16 DIAGNOSIS — R0789 Other chest pain: Secondary | ICD-10-CM | POA: Insufficient documentation

## 2019-08-16 DIAGNOSIS — I420 Dilated cardiomyopathy: Secondary | ICD-10-CM | POA: Insufficient documentation

## 2019-08-16 DIAGNOSIS — R899 Unspecified abnormal finding in specimens from other organs, systems and tissues: Secondary | ICD-10-CM | POA: Diagnosis not present

## 2019-08-16 LAB — MYOCARDIAL PERFUSION IMAGING
LV dias vol: 155 mL (ref 62–150)
LV sys vol: 90 mL
Peak HR: 82 {beats}/min
Rest HR: 55 {beats}/min
SDS: 4
SRS: 9
SSS: 13
TID: 1.07

## 2019-08-16 MED ORDER — REGADENOSON 0.4 MG/5ML IV SOLN
0.4000 mg | Freq: Once | INTRAVENOUS | Status: AC
  Administered 2019-08-16: 0.4 mg via INTRAVENOUS

## 2019-08-16 MED ORDER — TECHNETIUM TC 99M TETROFOSMIN IV KIT
10.7000 | PACK | Freq: Once | INTRAVENOUS | Status: AC | PRN
  Administered 2019-08-16: 10.7 via INTRAVENOUS
  Filled 2019-08-16: qty 11

## 2019-08-16 MED ORDER — AMINOPHYLLINE 25 MG/ML IV SOLN
75.0000 mg | Freq: Once | INTRAVENOUS | Status: AC
  Administered 2019-08-16: 75 mg via INTRAVENOUS

## 2019-08-16 MED ORDER — TECHNETIUM TC 99M TETROFOSMIN IV KIT
31.0000 | PACK | Freq: Once | INTRAVENOUS | Status: AC | PRN
  Administered 2019-08-16: 31 via INTRAVENOUS
  Filled 2019-08-16: qty 31

## 2019-08-21 ENCOUNTER — Ambulatory Visit (HOSPITAL_COMMUNITY): Payer: PPO | Attending: Internal Medicine

## 2019-08-21 ENCOUNTER — Other Ambulatory Visit: Payer: Self-pay

## 2019-08-21 DIAGNOSIS — I420 Dilated cardiomyopathy: Secondary | ICD-10-CM | POA: Diagnosis not present

## 2019-08-21 DIAGNOSIS — I10 Essential (primary) hypertension: Secondary | ICD-10-CM | POA: Diagnosis not present

## 2019-08-21 DIAGNOSIS — I447 Left bundle-branch block, unspecified: Secondary | ICD-10-CM | POA: Diagnosis not present

## 2019-08-21 DIAGNOSIS — N182 Chronic kidney disease, stage 2 (mild): Secondary | ICD-10-CM | POA: Diagnosis not present

## 2019-08-21 DIAGNOSIS — R0789 Other chest pain: Secondary | ICD-10-CM | POA: Diagnosis not present

## 2019-08-21 DIAGNOSIS — E785 Hyperlipidemia, unspecified: Secondary | ICD-10-CM

## 2019-08-21 DIAGNOSIS — E1122 Type 2 diabetes mellitus with diabetic chronic kidney disease: Secondary | ICD-10-CM | POA: Diagnosis not present

## 2019-08-22 ENCOUNTER — Other Ambulatory Visit: Payer: Self-pay

## 2019-08-22 DIAGNOSIS — I10 Essential (primary) hypertension: Secondary | ICD-10-CM

## 2019-08-22 DIAGNOSIS — I420 Dilated cardiomyopathy: Secondary | ICD-10-CM

## 2019-08-22 MED ORDER — ENTRESTO 49-51 MG PO TABS: 1.0000 | ORAL_TABLET | Freq: Two times a day (BID) | ORAL | 6 refills | Status: DC

## 2019-08-23 ENCOUNTER — Telehealth: Payer: Self-pay

## 2019-08-23 NOTE — Telephone Encounter (Signed)
Spoke to patient he stated his insurance requires a prior authorization on Entresto.Advised I will send to prior authorization nurse.

## 2019-08-24 ENCOUNTER — Telehealth: Payer: Self-pay

## 2019-08-24 NOTE — Telephone Encounter (Signed)
**Note De-Identified Jeffery Stone Obfuscation** I started a Entresto PA through covermymeds: Key: [id number]

## 2019-08-24 NOTE — Telephone Encounter (Signed)
**Note De-Identified Silvina Hackleman Obfuscation** Following message received from Covermymeds: Molinda Bailiff Key: [id number] - PA Case ID: [id number] Outcome  Approved today  PA Case :[id number] Status: Approved, Coverage Starts on: 08/24/2019 12:00:00 AM, Coverage Ends on: 08/23/2020 12:00:00 AM.  DrugEntresto 49-51MG  tablets  [employer] Medicare 4-Part NCPDP Electronic PA Form  Original Claim [phone] Prior Authorization Required NDA;T3;PA REQUIRED;Prior Authorization Required;;CALL [phone] LOG ON TO [email or web address] TO INITIATE EXCEPTION REQUEST;PROVIDE NOTICE-MEDICARE PRESCRIPTION DRUG COVERAGE AND YOUR R   I have notified [facility] of this approval.

## 2019-09-07 NOTE — Progress Notes (Signed)
Cardiology Office Note   Date:  09/07/2019   ID:  Jeffery Stone, DOB 06-04-1955, MRN [id number]  PCP:  Leighton Ruff, MD  Cardiologist:   Italo Banton Martinique, MD   No chief complaint on file.     History of Present Illness: Jeffery Stone is a 64 y.o. male who is seen at the request of Dr Drema Dallas for evaluation of cardiomyopathy and chest pain. He has a history of DM type 2, HLD, HTN and LBBB. He has a history of OSA. Aortic atherosclerosis noted on CT. He was last seen by me in 2012. Apparently has cardiomyopathy with EF 40-45%. Myoview perfusion study in 2006 showed no ischemia.   Recently he was seen for a routine physical. Noted some symptoms of chest pain around his ribs R>L. Noted more when doing yard work. On physical labs showed an Elevated CPK to 484. He was sent to the ED where troponin levels were normal x 2. Ecg showed his chronic LBBB. He states his CPK went down to 216 but then back up again. Simvastatin held. He comes of a knot in the area of his left upper thigh that comes and goes. Multiple scans of the area have been normal.   To further evaluate he had Myoview study showing a fixed septal perfusion abnormality felt to be c/w LBBB artifact. EF 42%. Subsequent Echo showed EF 35-40%. Global HK. Recommended starting on Entresto. He is tolerating this well. Still denies any chest pain, dyspnea. Myalgias are better but still has some.     Past Medical History:  Diagnosis Date  . Diabetes mellitus    type 2  . Hypercholesterolemia   . Hypertension   . LBBB (left bundle branch block)   . Nonischemic dilated cardiomyopathy (HCC)    Est. EF of 40-45%  . Sleep apnea     Past Surgical History:  Procedure Laterality Date  . BACK SURGERY       Current Outpatient Medications  Medication Sig Dispense Refill  . aspirin EC 81 MG tablet Take 81 mg by mouth daily.    [name] Microlet Lancets lancets USE TO TEST BLOOD SUGAR ONCE DAILY E11.9    . carvedilol (COREG) 6.25 MG tablet  Take 6.25 mg by mouth 2 (two) times daily with a meal.      . fenofibrate (TRICOR) 145 MG tablet Take 145 mg by mouth daily.      . multivitamin (THERAGRAN) per tablet Take 1 tablet by mouth daily.      . Omega-3 Fatty Acids (FISH OIL PO) Take 1 capsule by mouth daily.      . sacubitril-valsartan (ENTRESTO) 49-51 MG Take 1 tablet by mouth 2 (two) times daily. 60 tablet 6  . sildenafil (VIAGRA) 100 MG tablet TAKE 1/2 TABLET ONCE DAILY IF NEEDED     No current facility-administered medications for this visit.    Allergies:   Patient has no known allergies.    Social History:  The patient  reports that he has never smoked. He has never used smokeless tobacco. He reports that he does not drink alcohol or use drugs.   Family History:  The patient's family history includes Anorexia nervosa in his sister; Cancer in his father; Depression in his sister; Diabetes in his brother and father; Hyperlipidemia in his sister; Hypertension in his father and mother; Stroke in his mother; Transient ischemic attack in his mother.    ROS:  Please see the history of present illness.   Otherwise, review of  systems are positive for none.   All other systems are reviewed and negative.    PHYSICAL EXAM: VS:  There were no vitals taken for this visit. , BMI There is no height or weight on file to calculate BMI. GEN: Well nourished, well developed, in no acute distress  HEENT: normal  Neck: no JVD, carotid bruits, or masses Cardiac: RRR; no murmurs, rubs, or gallops,no edema  Respiratory:  clear to auscultation bilaterally, normal work of breathing GI: soft, nontender, nondistended, + BS MS: no deformity or atrophy  Skin: warm and dry, no rash Neuro:  Strength and sensation are intact Psych: euthymic mood, full affect   EKG:  EKG is not ordered today.   Recent Labs: 07/06/2019: BUN 33; Creatinine, Ser 1.47; Hemoglobin 12.0; Platelets 250; Potassium 4.2; Sodium 141    Lipid Panel No results found for:  CHOL, TRIG, HDL, CHOLHDL, VLDL, LDLCALC, LDLDIRECT   Dated 07/06/19: cholesterol 136, triglycerides 81, HDL 32, LDL 71. A1c 6%. Creatinine 1.2.. Hgb 12. Otherwise CMET and CBC normal.    Wt Readings from Last 3 Encounters:  08/16/19 189 lb (85.7 kg)  08/10/19 189 lb (85.7 kg)  07/06/19 197 lb (89.4 kg)      Other studies Reviewed: Additional studies/ records that were reviewed today include:   Myoview 08/16/19: Study Highlights    The left ventricular ejection fraction is moderately decreased (30-44%).  Nuclear stress EF: 42%.  There was no ST segment deviation noted during stress.  Defect 1: There is a large defect of mild severity present in the mid anteroseptal, mid inferoseptal and apical septal location.  This is an intermediate risk study.   Intermediate risk stress nuclear study due to moderately reduced left ventricular systolic function. There is a fixed septal perfusion defect consistent with LBBB-related artifact. No reversible ischemia is seen.    Echo 08/21/19: IMPRESSIONS    1. Left ventricular ejection fraction, by estimation, is 35 to 40%. The  left ventricle has moderately decreased function. The left ventricle  demonstrates global hypokinesis. The left ventricular internal cavity size  was mildly dilated. Left ventricular  diastolic parameters are indeterminate.  2. Right ventricular systolic function is normal. The right ventricular  size is normal.  3. The mitral valve is normal in structure. Mild mitral valve  regurgitation.  4. The aortic valve is normal in structure. Aortic valve regurgitation is  not visualized.    ASSESSMENT AND PLAN:  1.  Atypical chest pain. No ischemia noted on Myoview. Findings of fixed defect c/w LBBB artifact.  2. History of nonischemic CM with EF 40-45%. Asymptomatic. Was on  ARB and Coreg. I suspect this is at least partly related to dyssynchrony with his LBBB. Also may be related to HTN and DM. Now EF 42% by  Myoview and 35-40% by Echo. ARB switched to [facility]. Tolerating this well. Will have labs done next week with Dr Drema Dallas. I will follow up in 3 months. Titrate Entresto as tolerated. 3. Elevated CPK. Not cardiac. Suspect related to statin. If does not improve with cessation of statin may need to consider further assessment for myopathy. 4. LBBB 5. DM type 2 6. HTN 7. HLD   Current medicines are reviewed at length with the patient today.  The patient has no concerns regarding medicines.  The following changes have been made:  no change  Labs/ tests ordered today include:   No orders of the defined types were placed in this encounter.    Disposition:   FU  with me 3 months  Signed, Cher Franzoni Martinique, MD  09/07/2019 10:31 AM    [place] [address] [place], [place], [address] Phone [phone], Fax [phone]

## 2019-09-08 ENCOUNTER — Encounter: Payer: Self-pay | Admitting: Cardiology

## 2019-09-08 ENCOUNTER — Other Ambulatory Visit: Payer: Self-pay

## 2019-09-08 ENCOUNTER — Ambulatory Visit: Payer: PPO | Admitting: Cardiology

## 2019-09-08 VITALS — BP 122/68 | HR 79 | Ht 74.0 in | Wt 188.4 lb

## 2019-09-08 DIAGNOSIS — E785 Hyperlipidemia, unspecified: Secondary | ICD-10-CM | POA: Diagnosis not present

## 2019-09-08 DIAGNOSIS — I447 Left bundle-branch block, unspecified: Secondary | ICD-10-CM | POA: Diagnosis not present

## 2019-09-08 DIAGNOSIS — I420 Dilated cardiomyopathy: Secondary | ICD-10-CM

## 2019-09-08 DIAGNOSIS — R0789 Other chest pain: Secondary | ICD-10-CM

## 2019-09-08 DIAGNOSIS — I10 Essential (primary) hypertension: Secondary | ICD-10-CM

## 2019-09-15 DIAGNOSIS — I10 Essential (primary) hypertension: Secondary | ICD-10-CM | POA: Diagnosis not present

## 2019-09-15 DIAGNOSIS — E1122 Type 2 diabetes mellitus with diabetic chronic kidney disease: Secondary | ICD-10-CM | POA: Diagnosis not present

## 2019-09-15 DIAGNOSIS — E7800 Pure hypercholesterolemia, unspecified: Secondary | ICD-10-CM | POA: Diagnosis not present

## 2019-09-15 DIAGNOSIS — E782 Mixed hyperlipidemia: Secondary | ICD-10-CM | POA: Diagnosis not present

## 2019-09-15 DIAGNOSIS — N183 Chronic kidney disease, stage 3 unspecified: Secondary | ICD-10-CM | POA: Diagnosis not present

## 2019-09-26 ENCOUNTER — Telehealth: Payer: Self-pay | Admitting: Cardiology

## 2019-09-26 DIAGNOSIS — I420 Dilated cardiomyopathy: Secondary | ICD-10-CM

## 2019-09-26 DIAGNOSIS — E785 Hyperlipidemia, unspecified: Secondary | ICD-10-CM

## 2019-09-26 DIAGNOSIS — I10 Essential (primary) hypertension: Secondary | ICD-10-CM

## 2019-09-26 NOTE — Telephone Encounter (Signed)
Spoke to patient he stated he needs lab orders faxed to PCP Dr.Barnes.Lab orders for bmet,lipid and hepatic panels faxed to Dr.Barnes at fax # [phone].

## 2019-09-26 NOTE — Telephone Encounter (Signed)
Patient states that his PCP office did not receive lab order that they needed to do for Dr. Martinique.  He states he needs to discuss this with the nurse.

## 2019-09-27 NOTE — Telephone Encounter (Signed)
Called patient no answer.Unable to leave a voice mail mailbox is full.

## 2019-09-27 NOTE — Telephone Encounter (Signed)
Follow up    Pt is calling and says [facility] will not do the lab orders.  He would like for [facility] to call him     Please call

## 2019-09-28 NOTE — Telephone Encounter (Signed)
Spoke to patient he stated he will have fasting lab bmet,lipid and hepatic panels done at our office in the morning.Orders placed.

## 2019-09-28 NOTE — Telephone Encounter (Signed)
Patient called back returning Cheryl's call. Call was transferred to [facility]

## 2019-09-29 DIAGNOSIS — I420 Dilated cardiomyopathy: Secondary | ICD-10-CM | POA: Diagnosis not present

## 2019-09-29 DIAGNOSIS — E785 Hyperlipidemia, unspecified: Secondary | ICD-10-CM | POA: Diagnosis not present

## 2019-09-29 DIAGNOSIS — I10 Essential (primary) hypertension: Secondary | ICD-10-CM | POA: Diagnosis not present

## 2019-09-29 LAB — BASIC METABOLIC PANEL
BUN/Creatinine Ratio: 26 — ABNORMAL HIGH (ref 10–24)
BUN: 31 mg/dL — ABNORMAL HIGH (ref 8–27)
CO2: 20 mmol/L (ref 20–29)
Calcium: 9.2 mg/dL (ref 8.6–10.2)
Chloride: 103 mmol/L (ref 96–106)
Creatinine, Ser: 1.21 mg/dL (ref 0.76–1.27)
GFR calc Af Amer: 72 mL/min/{1.73_m2} (ref >59)
GFR calc non Af Amer: 62 mL/min/{1.73_m2} (ref >59)
Glucose: 90 mg/dL (ref 65–99)
Potassium: 4.6 mmol/L (ref 3.5–5.2)
Sodium: 139 mmol/L (ref 134–144)

## 2019-09-29 LAB — LIPID PANEL
Chol/HDL Ratio: 6 ratio — ABNORMAL HIGH (ref 0.0–5.0)
Cholesterol, Total: 179 mg/dL (ref 100–199)
HDL: 30 mg/dL — ABNORMAL LOW (ref >39)
LDL Chol Calc (NIH): 129 mg/dL — ABNORMAL HIGH (ref 0–99)
Triglycerides: 109 mg/dL (ref 0–149)
VLDL Cholesterol Cal: 20 mg/dL (ref 5–40)

## 2019-09-29 LAB — HEPATIC FUNCTION PANEL
ALT: 18 IU/L (ref 0–44)
AST: 25 IU/L (ref 0–40)
Albumin: 4.7 g/dL (ref 3.8–4.8)
Alkaline Phosphatase: 38 IU/L — ABNORMAL LOW (ref 48–121)
Bilirubin Total: 0.3 mg/dL (ref 0.0–1.2)
Bilirubin, Direct: 0.11 mg/dL (ref 0.00–0.40)
Total Protein: 7.2 g/dL (ref 6.0–8.5)

## 2019-10-09 ENCOUNTER — Other Ambulatory Visit: Payer: Self-pay

## 2019-10-09 DIAGNOSIS — E7800 Pure hypercholesterolemia, unspecified: Secondary | ICD-10-CM

## 2019-10-09 DIAGNOSIS — Z79899 Other long term (current) drug therapy: Secondary | ICD-10-CM

## 2019-10-09 MED ORDER — EZETIMIBE 10 MG PO TABS: 10.0000 mg | ORAL_TABLET | Freq: Every day | ORAL | 6 refills | Status: DC

## 2019-10-23 DIAGNOSIS — H26492 Other secondary cataract, left eye: Secondary | ICD-10-CM | POA: Diagnosis not present

## 2019-10-23 DIAGNOSIS — E119 Type 2 diabetes mellitus without complications: Secondary | ICD-10-CM | POA: Diagnosis not present

## 2019-11-02 NOTE — Progress Notes (Signed)
Office Visit Note  Patient: Jeffery Stone             Date of Birth: 1956/02/05           MRN: [id number]             PCP: Leighton Ruff, MD Referring: Leighton Ruff, MD Visit Date: 11/16/2019 Occupation: @GUAROCC @  Subjective:  Pain in leg muscles.   History of Present Illness: Jeffery Stone is a 64 y.o. male seen in consultation per request of his PCP for evaluation of muscle pain.  According to the patient he has been on simvastatin for about 15 years.  He states in April 2020 he had to retire due to job cards from COVID-19 pandemic.  At the same time he was told that the statins can cause muscle pain.  He states he started experiencing pain in his bilateral thighs at nighttime.  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  He was lifting heavy objects and also moving heavy appliances.  The leg muscle pain persist he also started experiencing some lower abdominal pain.  He had a CT scan of his lower abdomen which was unremarkable.  He states when he went for his physical it was 1 day after having the Covid vaccine and he also had a tick bite prior to that.  He was found to have elevated CK and he was sent to the emergency room where the work-up was negative he was also evaluated by a cardiologist.  The cardiologist switched him from his statins to [place] in June 2021.  He states since he stopped the statins the lower extremity pain has improved although he still have some tenderness in his legs and lower abdomen.  He states it could be related to the heavy objects he recently moved.  He also describes some pain over the trochanteric bursa after walking for prolonged. He is also concerned as over the last few years he has had several tick bites and he has had a rash which lasted for several months.  He states he has been treated for Lyme disease in the past but he did not develop any symptoms.  He states the labs done by  his PCP also showed some antibodies against RMSF.  There is no history of shortness of breath or palpitations.  Activities of Daily Living:  Patient reports morning stiffness for 0 minutes.   Patient Reports nocturnal pain.  Difficulty dressing/grooming: Denies Difficulty climbing stairs: Denies Difficulty getting out of chair: Denies Difficulty using hands for taps, buttons, cutlery, and/or writing: Denies  Review of Systems  Constitutional: Negative for fatigue and night sweats.  HENT: Negative for mouth sores, mouth dryness and nose dryness.   Eyes: Negative for redness, itching and dryness.  Respiratory: Negative for shortness of breath and difficulty breathing.   Cardiovascular: Negative for chest pain, palpitations, hypertension, irregular heartbeat and swelling in legs/feet.  Gastrointestinal: Negative for blood in stool, constipation and diarrhea.  Endocrine: Positive for increased urination.  Genitourinary: Negative for difficulty urinating.  Musculoskeletal: Positive for arthralgias, joint pain, myalgias, muscle tenderness and myalgias. Negative for joint swelling, muscle weakness and morning stiffness.  Skin: Negative for color change, rash, hair loss, nodules/bumps, redness, skin tightness, ulcers and sensitivity to sunlight.  Allergic/Immunologic: Negative for susceptible to infections.  Neurological: Positive for dizziness. Negative for fainting, numbness, headaches, memory loss, night sweats and weakness.  Hematological: Positive for bruising/bleeding tendency.  Negative for swollen glands.  Psychiatric/Behavioral: Negative for depressed mood, confusion and sleep disturbance. The patient is not nervous/anxious.     PMFS History:  Patient Active Problem List   Diagnosis Date Noted  . Nonischemic dilated cardiomyopathy ([place])   . Left bundle branch block 01/14/2011  . Hypertension 01/14/2011  . Hypercholesterolemia 01/14/2011  . Diabetes mellitus ([place]) 01/14/2011      Past Medical History:  Diagnosis Date  . Diabetes mellitus    type 2  . Hypercholesterolemia   . Hypertension   . LBBB (left bundle branch block)   . Nonischemic dilated cardiomyopathy (HCC)    Est. EF of 40-45%  . Sleep apnea     Family History  Problem Relation Age of Onset  . Diabetes Father   . Hypertension Father        ?  Marland Kitchen Cancer Father        kidney  . Hypertension Mother   . Stroke Mother   . Transient ischemic attack Mother        multiple  . Dementia Mother   . Liver cancer Mother   . Heart attack Brother   . Healthy Son   . Healthy Son    Past Surgical History:  Procedure Laterality Date  . BACK SURGERY     Social History   Social History Narrative  . Not on file   Immunization History  Administered Date(s) Administered  . Influenza,inj,Quad PF,6+ Mos 02/08/2013     Objective: Vital Signs: BP 98/62 (BP Location: Right Arm, Patient Position: Sitting, Cuff Size: Normal)   Pulse 70   Resp 16   Ht 6\' 2"  (1.88 m)   Wt 189 lb 12.8 oz (86.1 kg)   BMI 24.37 kg/m    Physical Exam Vitals and nursing note reviewed.  Constitutional:      Appearance: He is well-developed.  HENT:     Head: Normocephalic and atraumatic.  Eyes:     Conjunctiva/sclera: Conjunctivae normal.     Pupils: Pupils are equal, round, and reactive to light.  Cardiovascular:     Rate and Rhythm: Normal rate and regular rhythm.     Heart sounds: Normal heart sounds.  Pulmonary:     Effort: Pulmonary effort is normal.     Breath sounds: Normal breath sounds.  Abdominal:     General: Bowel sounds are normal.     Palpations: Abdomen is soft.  Musculoskeletal:     Cervical back: Normal range of motion and neck supple.  Skin:    General: Skin is warm and dry.     Capillary Refill: Capillary refill takes less than 2 seconds.  Neurological:     Mental Status: He is alert and oriented to person, place, and time.     Comments: No muscle weakness was noted.  He was able to get up  from the squatting position.  DTRs were intact.  Psychiatric:        Behavior: Behavior normal.      Musculoskeletal Exam: C-spine, thoracic and lumbar spine with good range of motion.  Shoulder joints, elbow joints, wrist joints, MCPs PIPs and DIPs with good range of motion with no synovitis.  He has bilateral PIP and DIP thickening consistent with osteoarthritis.  Hip joints, knee joints, ankles, MTPs and PIPs with good range of motion with no synovitis.  He had no difficulty getting up from the squatting position or getting up from the chair.  CDAI Exam: CDAI Score: -- Patient Global: --; Provider  Global: -- Swollen: --; Tender: -- Joint Exam 11/16/2019   No joint exam has been documented for this visit   There is currently no information documented on the homunculus. Go to the Rheumatology activity and complete the homunculus joint exam.  Investigation: No additional findings.  Imaging: No results found.  Recent Labs: Lab Results  Component Value Date   WBC 6.5 07/06/2019   HGB 12.0 (L) 07/06/2019   PLT 250 07/06/2019   NA 139 09/29/2019   K 4.6 09/29/2019   CL 103 09/29/2019   CO2 20 09/29/2019   GLUCOSE 90 09/29/2019   BUN 31 (H) 09/29/2019   CREATININE 1.21 09/29/2019   BILITOT 0.3 09/29/2019   ALKPHOS 38 (L) 09/29/2019   AST 25 09/29/2019   ALT 18 09/29/2019   PROT 7.2 09/29/2019   ALBUMIN 4.7 09/29/2019   CALCIUM 9.2 09/29/2019   GFRAA 72 09/29/2019  September 29, 2019 LDL 129  Speciality Comments: No specialty comments available.  Procedures:  No procedures performed Allergies: Patient has no known allergies.   Assessment / Plan:     Visit Diagnoses: Elevated CPK - 07/06/19:CPK 484, aldolase 7.4.  CPK came down slightly after d/c statin.  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  He also had some discomfort in the lower abdominal muscles which he relates to lifting  heavy appliances.  He had no difficulty getting up from the squatting position.  His DTRs were intact.  He has no difficulty getting up from the chair.  He denies any difficulty climbing the stairs.  There is no history of shortness of breath.  No rash was noted on examination today.  Plan: Sedimentation rate, CK, TSH, ANA, Aldolase, Myositis Assessr Plus Jo-1 Autoabs, Anti-HMGCR Ab IgG, VITAMIN D 25 Hydroxy (Vit-D Deficiency, Fractures), Magnesium  Myalgia - Plan: VITAMIN D 25 Hydroxy (Vit-D Deficiency, Fractures), Magnesium  Primary osteoarthritis of both hands-clinical findings are consistent with osteoarthritis.  He has some stiffness but not much pain.  DDD (degenerative disc disease), lumbar - Status post discectomy 1995.  He does not have much lower back pain.  Essential hypertension-his blood pressure is well controlled.  Other medical problems listed as follows:  Nonischemic dilated cardiomyopathy (HCC)  LBBB (left bundle branch block)  Hypercholesterolemia-he was previously treated with simvastatin for about 15 years.  He is on Zetia now.  Chronic kidney disease due to type 2 diabetes mellitus (HCC)  Hepatic steatosis  OSA (obstructive sleep apnea)  Tick bite sequela-patient states he has had several tick bites over the last few years.  He was treated for Lyme disease at one time but he did not have any symptoms.  He also had RMSF antibodies.  Orders: Orders Placed This Encounter  Procedures  . Sedimentation rate  . CK  . TSH  . ANA  . Aldolase  . Myositis Assessr Plus Jo-1 Autoabs  . Anti-HMGCR Ab IgG  . VITAMIN D 25 Hydroxy (Vit-D Deficiency, Fractures)  . Magnesium   No orders of the defined types were placed in this encounter.     Follow-Up Instructions: No follow-ups on file.   Bo Merino, MD  Note - This record has been created using Editor, commissioning.  Chart creation errors have been sought, but may not always  have been located. Such creation  errors do not reflect on  the standard of medical care.

## 2019-11-16 ENCOUNTER — Encounter: Payer: Self-pay | Admitting: Rheumatology

## 2019-11-16 ENCOUNTER — Ambulatory Visit: Payer: PPO | Admitting: Rheumatology

## 2019-11-16 ENCOUNTER — Other Ambulatory Visit: Payer: Self-pay

## 2019-11-16 VITALS — BP 98/62 | HR 70 | Resp 16 | Ht 74.0 in | Wt 189.8 lb

## 2019-11-16 DIAGNOSIS — E1122 Type 2 diabetes mellitus with diabetic chronic kidney disease: Secondary | ICD-10-CM

## 2019-11-16 DIAGNOSIS — I420 Dilated cardiomyopathy: Secondary | ICD-10-CM | POA: Diagnosis not present

## 2019-11-16 DIAGNOSIS — M791 Myalgia, unspecified site: Secondary | ICD-10-CM | POA: Diagnosis not present

## 2019-11-16 DIAGNOSIS — M19042 Primary osteoarthritis, left hand: Secondary | ICD-10-CM

## 2019-11-16 DIAGNOSIS — K760 Fatty (change of) liver, not elsewhere classified: Secondary | ICD-10-CM

## 2019-11-16 DIAGNOSIS — I10 Essential (primary) hypertension: Secondary | ICD-10-CM | POA: Diagnosis not present

## 2019-11-16 DIAGNOSIS — I447 Left bundle-branch block, unspecified: Secondary | ICD-10-CM | POA: Diagnosis not present

## 2019-11-16 DIAGNOSIS — R748 Abnormal levels of other serum enzymes: Secondary | ICD-10-CM

## 2019-11-16 DIAGNOSIS — M5136 Other intervertebral disc degeneration, lumbar region: Secondary | ICD-10-CM | POA: Diagnosis not present

## 2019-11-16 DIAGNOSIS — W57XXXS Bitten or stung by nonvenomous insect and other nonvenomous arthropods, sequela: Secondary | ICD-10-CM

## 2019-11-16 DIAGNOSIS — M19041 Primary osteoarthritis, right hand: Secondary | ICD-10-CM

## 2019-11-16 DIAGNOSIS — G4733 Obstructive sleep apnea (adult) (pediatric): Secondary | ICD-10-CM | POA: Diagnosis not present

## 2019-11-16 DIAGNOSIS — E7800 Pure hypercholesterolemia, unspecified: Secondary | ICD-10-CM | POA: Diagnosis not present

## 2019-11-17 ENCOUNTER — Ambulatory Visit: Payer: PPO | Admitting: Rheumatology

## 2019-11-17 ENCOUNTER — Other Ambulatory Visit: Payer: Self-pay | Admitting: *Deleted

## 2019-11-17 DIAGNOSIS — E559 Vitamin D deficiency, unspecified: Secondary | ICD-10-CM

## 2019-11-17 IMAGING — CT CT PELVIS W/O CM
2 of 4 series · 16 of 46 positions shown, 19 images · non-contrast
Comparison: April 02, 2017.

CLINICAL DATA: Left inguinal mass.

EXAM:
CT PELVIS WITHOUT CONTRAST
TECHNIQUE: Multidetector CT imaging of the pelvis was performed following the
standard protocol without intravenous contrast.

[Series 2: axial bone · axial · 0.88mm/px · z∈[-348,-42]mm · 13 of 170 slices shown, 16 images]
[im 11/170  soft-tissue]
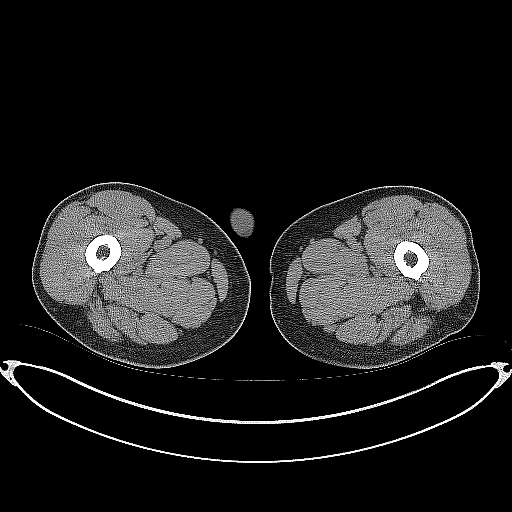
[im 11/170  bone]
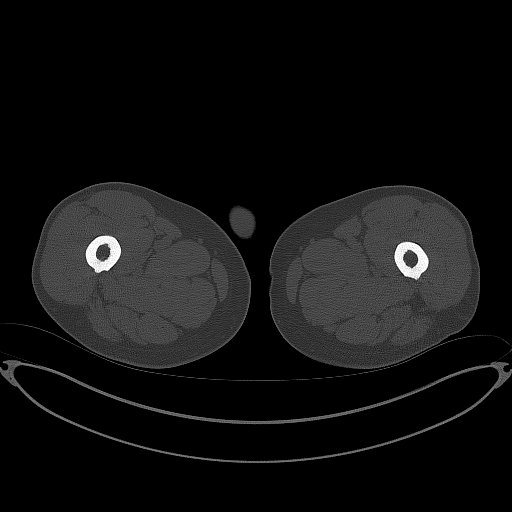
[im 28/170  soft-tissue]
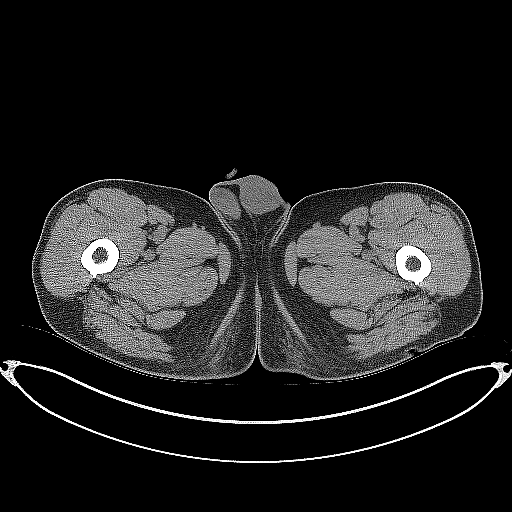
[im 44/170  soft-tissue]
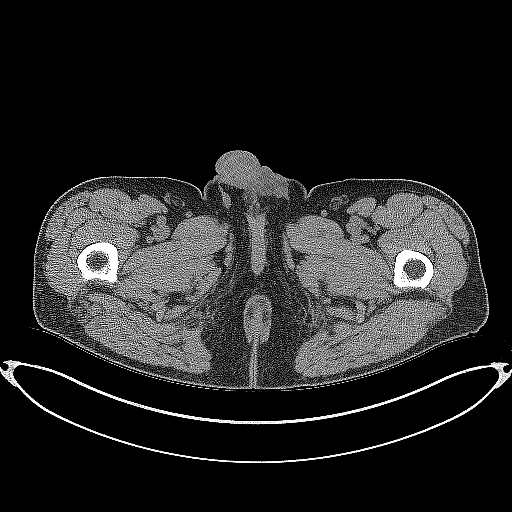
[im 60/170  soft-tissue]
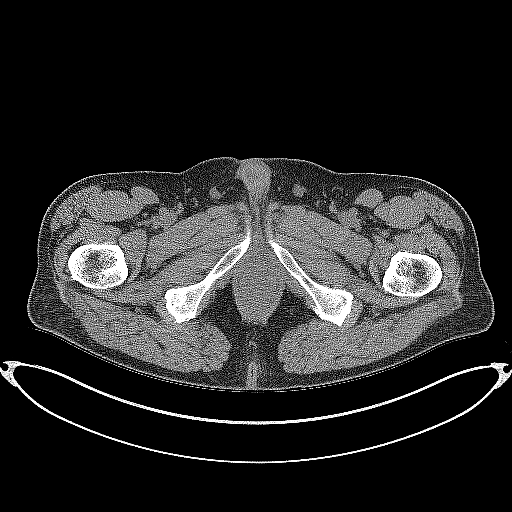
[im 77/170  soft-tissue]
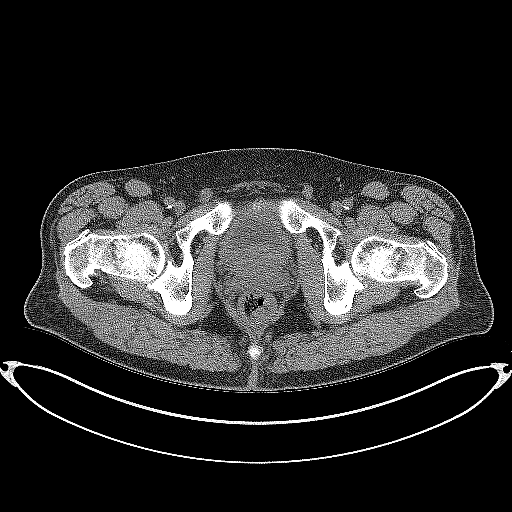
[im 93/170  soft-tissue]
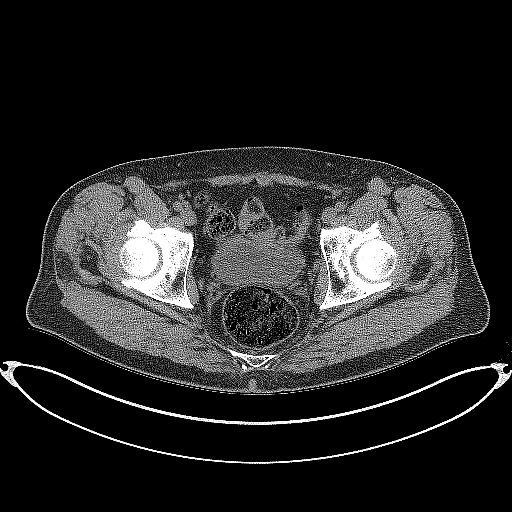
[im 110/170  soft-tissue]
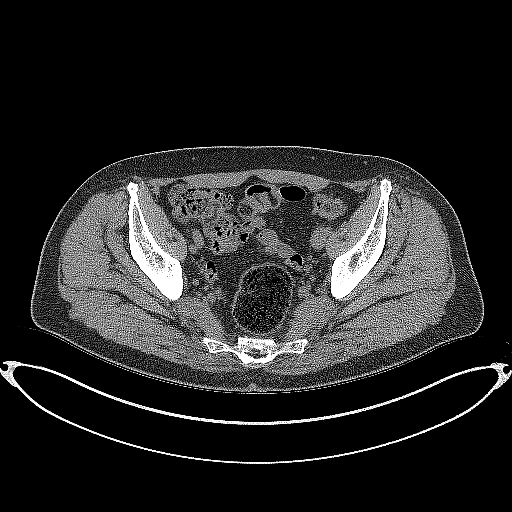
[im 126/170  soft-tissue]
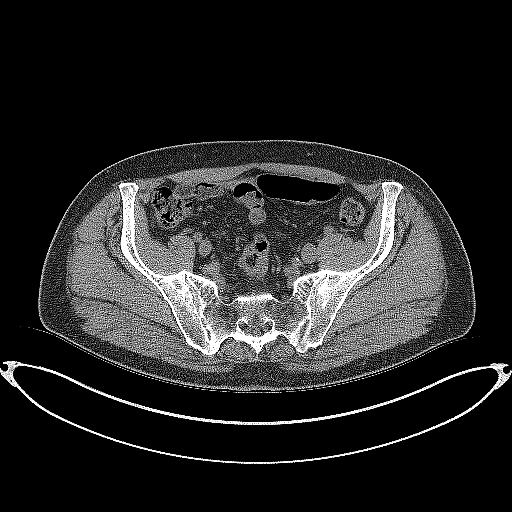
[im 142/170  soft-tissue]
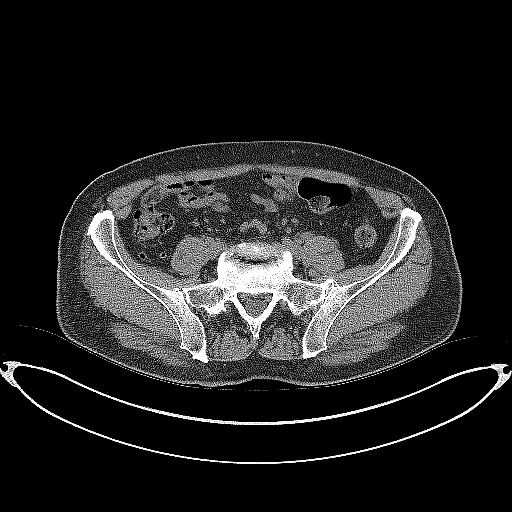
[im 142/170  bone]
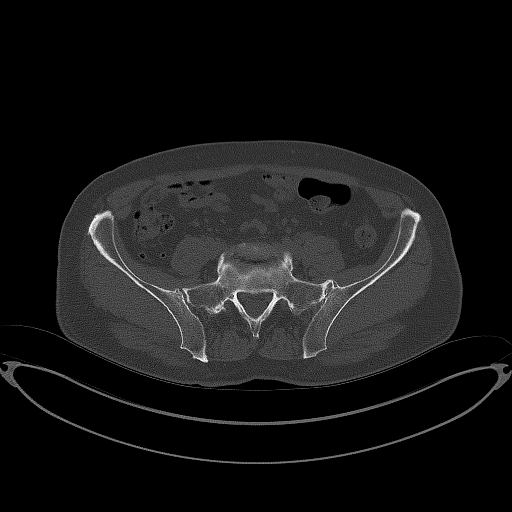
[im 148/170  lung]
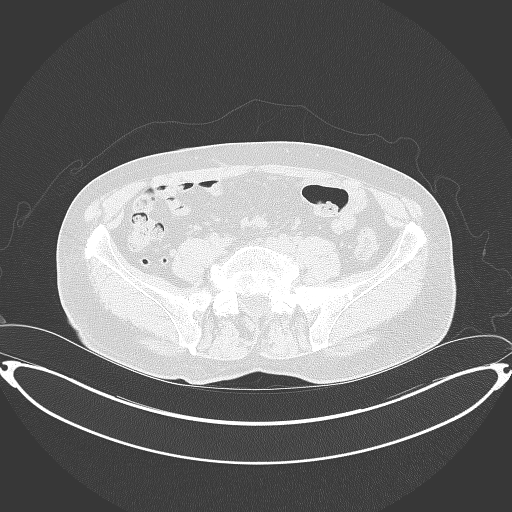
[im 153/170  lung]
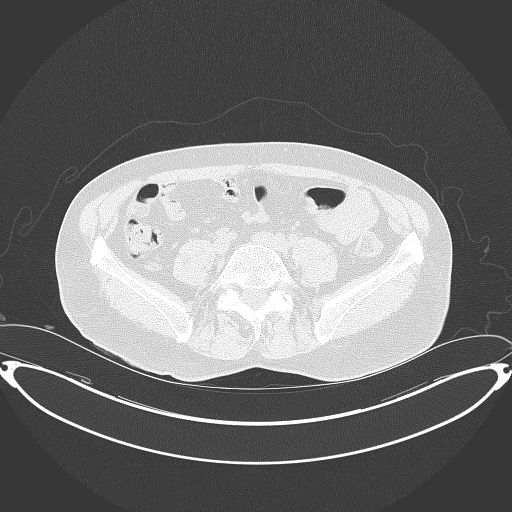
[im 159/170  soft-tissue]
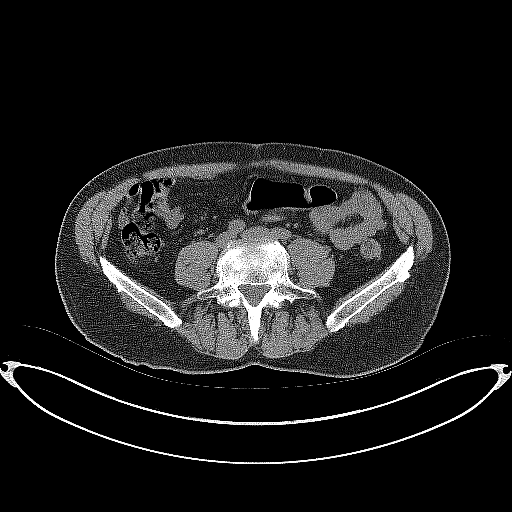
[im 159/170  lung]
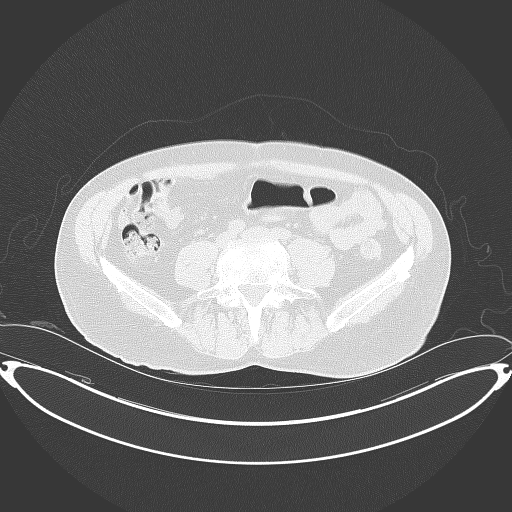
[im 164/170  lung]
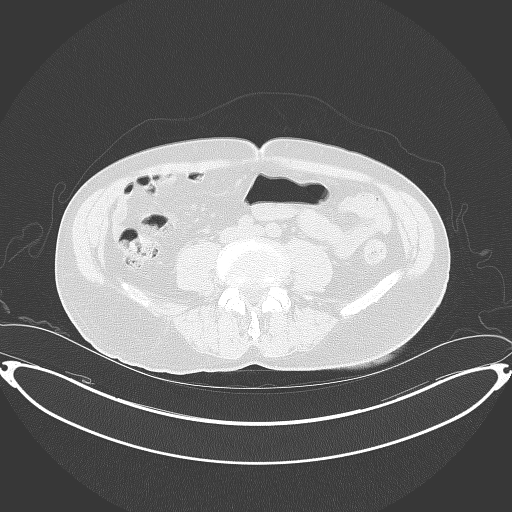

[Series 5: coronal st · coronal · 0.71mm/px · 3 of 112 slices shown]
[im 38/112  soft-tissue]
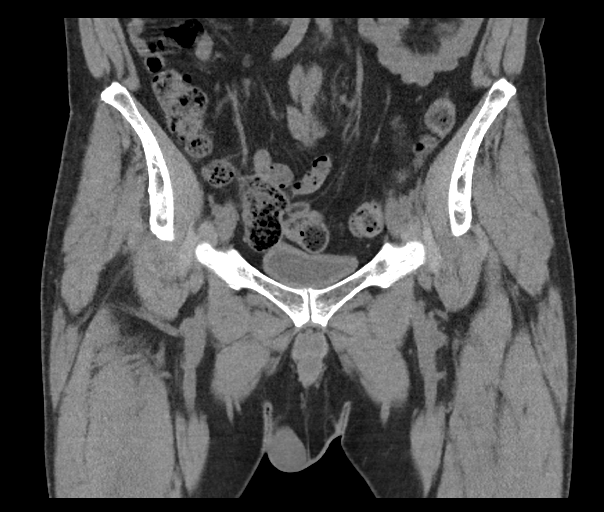
[im 50/112  soft-tissue]
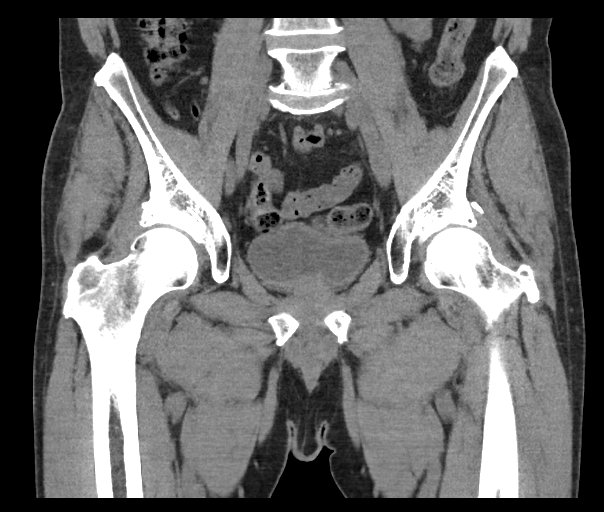
[im 62/112  soft-tissue]
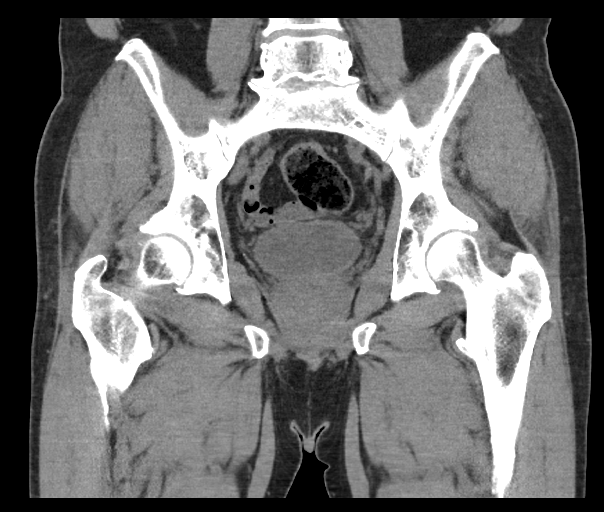

[16 of 46 positions shown; findings below may reference images not displayed]

FINDINGS: Urinary Tract:  No abnormality visualized.

Bowel:  Unremarkable visualized pelvic bowel loops.

Vascular/Lymphatic: No pathologically enlarged lymph nodes. No
significant vascular abnormality seen.

Reproductive:  No mass or other significant abnormality

Other: There is no definite evidence of inguinal hernia or mass.
There is no corresponding abnormality in the area of the superficial
marker placed in the medial portion of the left thigh indicating
area of palpable concern. No abnormal fluid collection is noted.

Musculoskeletal: No suspicious bone lesions identified.
IMPRESSION: No significant abnormality seen in the pelvis. No definite
abnormality seen in the area of palpable concern in the left
inguinal region.

## 2019-11-17 NOTE — Telephone Encounter (Signed)
-----  Message from Ofilia Neas, PA-C sent at 11/17/2019  8:11 AM EDT ----- Vitamin D is low-25. Please notify the patient and send in vitamin D 50,000 units by mouth once weekly for 3 months.  Recheck vitamin D in 3 months.  ESR WNL.  CK WNL.  TSH WNL. Mg WNL.

## 2019-11-18 MED ORDER — VITAMIN D (ERGOCALCIFEROL) 1.25 MG (50000 UNIT) PO CAPS: 50000.0000 [IU] | ORAL_CAPSULE | ORAL | 0 refills | Status: DC

## 2019-11-21 NOTE — Progress Notes (Signed)
Add ENA, C3, C4.

## 2019-11-24 NOTE — Progress Notes (Signed)
I will discuss results at the follow-up visit.

## 2019-11-27 NOTE — Progress Notes (Signed)
Office Visit Note  Patient: Jeffery Stone             Date of Birth: September 26, 1955           MRN: [id number]             PCP: Leighton Ruff, MD Referring: Leighton Ruff, MD Visit Date: 12/11/2019 Occupation: _0 @  Subjective:  Other (poison ivy on bilateral legs- onset Saturday)   History of Present Illness: Jeffery Stone is a 64 y.o. male with history of myalgias.  He states he continues to have some muscle pain but it is better now.  He denies any muscle weakness.  He denies any history of oral ulcers, nasal ulcers, malar rash, photosensitivity, sicca symptoms.  He states he was working in his yard with his son and was radiating.  He developed some bites on his bilateral lower extremities and few on his hands.  He was unsure if it was poison ivy or chigger bites.  He states it was very itchy yesterday.  He used over-the-counter 10% hydrocortisone and the symptoms are improving.  Activities of Daily Living:  Patient reports morning stiffness for a few minutes.   Patient Denies nocturnal pain.  Difficulty dressing/grooming: Denies Difficulty climbing stairs: Denies Difficulty getting out of chair: Denies Difficulty using hands for taps, buttons, cutlery, and/or writing: Denies  Review of Systems  Constitutional: Negative for fatigue.  HENT: Negative for mouth sores, mouth dryness and nose dryness.   Eyes: Negative for itching and dryness.  Respiratory: Negative for shortness of breath and difficulty breathing.   Cardiovascular: Negative for chest pain and palpitations.  Gastrointestinal: Negative for blood in stool, constipation and diarrhea.  Endocrine: Negative for increased urination.  Genitourinary: Negative for difficulty urinating.  Musculoskeletal: Positive for morning stiffness. Negative for arthralgias, joint pain, joint swelling, myalgias, muscle tenderness and myalgias.  Skin: Positive for rash. Negative for color change.  Allergic/Immunologic: Negative for  susceptible to infections.  Neurological: Negative for dizziness, numbness, headaches, memory loss and weakness.  Hematological: Positive for bruising/bleeding tendency.  Psychiatric/Behavioral: Negative for confusion.    PMFS History:  Patient Active Problem List   Diagnosis Date Noted  . Nonischemic dilated cardiomyopathy ([place])   . Left bundle branch block 01/14/2011  . Hypertension 01/14/2011  . Hypercholesterolemia 01/14/2011  . Diabetes mellitus ([place]) 01/14/2011    Past Medical History:  Diagnosis Date  . Diabetes mellitus    type 2  . Hypercholesterolemia   . Hypertension   . LBBB (left bundle branch block)   . Nonischemic dilated cardiomyopathy (H[facility])    Est. EF of 40-45%  . Sleep apnea     Family History  Problem Relation Age of Onset  . Diabetes Father   . Hypertension Father        ?  Marland Kitchen Cancer Father        kidney  . Hypertension Mother   . Stroke Mother   . Transient ischemic attack Mother        multiple  . Dementia Mother   . Liver cancer Mother   . Heart attack Brother   . Healthy Son   . Healthy Son    Past Surgical History:  Procedure Laterality Date  . BACK SURGERY     Social History   Social History Narrative  . Not on file   Immunization History  Administered Date(s) Administered  . Influenza,inj,Quad PF,6+ Mos 02/08/2013  . PFIZER SARS-COV-2 Vaccination 07/10/2019, 07/31/2019     Objective: Vital Signs: BP  103/68 (BP Location: Left Arm, Patient Position: Sitting, Cuff Size: Normal)   Pulse 71   Resp 16   Ht 6' 2" (1.88 m)   Wt 188 lb 6.4 oz (85.5 kg)   BMI 24.19 kg/m    Physical Exam Vitals and nursing note reviewed.  Constitutional:      Appearance: He is well-developed.  HENT:     Head: Normocephalic and atraumatic.  Eyes:     Conjunctiva/sclera: Conjunctivae normal.     Pupils: Pupils are equal, round, and reactive to light.  Cardiovascular:     Rate and Rhythm: Normal rate and regular rhythm.     Heart sounds: Normal  heart sounds.  Pulmonary:     Effort: Pulmonary effort is normal.     Breath sounds: Normal breath sounds.  Abdominal:     General: Bowel sounds are normal.     Palpations: Abdomen is soft.  Musculoskeletal:     Cervical back: Normal range of motion and neck supple.  Skin:    General: Skin is warm and dry.     Capillary Refill: Capillary refill takes less than 2 seconds.     Comments: Few papular lesions were noted on his bilateral lower extremities which appear to be chigger bites.  Neurological:     Mental Status: He is alert and oriented to person, place, and time.  Psychiatric:        Behavior: Behavior normal.      Musculoskeletal Exam: C-spine, thoracic and lumbar spine with good range of motion.  Shoulder joints, elbow joints, wrist joints, MCPs PIPs and DIPs with good range of motion.  He has some PIP and DIP thickening.  Hip joints, knee joints, ankles, MTPs and PIPs with good range of motion with no synovitis.  CDAI Exam: CDAI Score: -- Patient Global: --; Provider Global: -- Swollen: --; Tender: -- Joint Exam 12/11/2019   No joint exam has been documented for this visit   There is currently no information documented on the homunculus. Go to the Rheumatology activity and complete the homunculus joint exam.  Investigation: No additional findings.  Imaging: No results found.  Recent Labs: Lab Results  Component Value Date   WBC 6.5 07/06/2019   HGB 12.0 (L) 07/06/2019   PLT 250 07/06/2019   NA 139 09/29/2019   K 4.6 09/29/2019   CL 103 09/29/2019   CO2 20 09/29/2019   GLUCOSE 90 09/29/2019   BUN 31 (H) 09/29/2019   CREATININE 1.21 09/29/2019   BILITOT 0.3 09/29/2019   ALKPHOS 38 (L) 09/29/2019   AST 25 09/29/2019   ALT 18 09/29/2019   PROT 7.2 09/29/2019   ALBUMIN 4.7 09/29/2019   CALCIUM 9.2 09/29/2019   GFRAA 72 09/29/2019  November 16, 2019 ANA 1: 640 speckled, ENA negative, C3-C4 normal, CK 193, aldolase 4.5, magnesium 2.3, Jo 1 -, vitamin D 25, TSH  normal, ESR 2 07/06/19:CPK 484, aldolase 7.4.  Speciality Comments: No specialty comments available.  Procedures:  No procedures performed Allergies: Poison ivy extract   Assessment / Plan:     Visit Diagnoses: Elevated CPK - Repeat CK normal, aldolase normal, Jo 1 -, ANA 1: 640 speckled, ENA negative, complements normal.  He has no clinical features of autoimmune disease.  He believes this abnormal labs and myalgias a started after COVID-19 vaccination.  His symptoms are gradually improving.  He plans to get his COVID-19 booster when available.  He was advised to contact me in case he develops any new symptoms.  Otherwise he will be returning for follow-up in 8 months.  Myalgia-improvement noted by patient.  He had no muscular weakness.  CK is within normal limits now.  Aldolase is normal.  Vitamin D deficiency-he was placed on vitamin D 50,000 units/week.  He will get vitamin D level in 3 months.  He was advised to use maintenance over-the-counter vitamin D after that.  Primary osteoarthritis of both hands-joint protection was discussed.  DDD (degenerative disc disease), lumbar - Discectomy 1995.  Essential hypertension  Nonischemic dilated cardiomyopathy (HCC)  LBBB (left bundle branch block)  Hypercholesterolemia - Treated with simvastatin for 15 years.  He is on Zetia now.  Chronic kidney disease due to type 2 diabetes mellitus (HCC)  Hepatic steatosis  OSA (obstructive sleep apnea)  Tick bite, sequela  Orders: No orders of the defined types were placed in this encounter.  No orders of the defined types were placed in this encounter.  .  Follow-Up Instructions: Return in about 8 months (around 08/10/2020), or if symptoms worsen or fail to improve, for Osteoarthritis.   Bo Merino, MD  Note - This record has been created using Editor, commissioning.  Chart creation errors have been sought, but may not always  have been located. Such creation errors do not reflect  on  the standard of medical care.

## 2019-12-01 LAB — SM AND SM/RNP ANTIBODIES
ENA SM Ab Ser-aCnc: <1 AI
SM/RNP: <1 AI

## 2019-12-01 LAB — MYOSITIS ASSESSR PLUS JO-1 AUTOABS
EJ Autoabs: NOT DETECTED
Jo-1 Autoabs: <1 AI
Ku Autoabs: NOT DETECTED
Mi-2 Autoabs: NOT DETECTED
OJ Autoabs: NOT DETECTED
PL-12 Autoabs: NOT DETECTED
PL-7 Autoabs: NOT DETECTED
SRP Autoabs: NOT DETECTED

## 2019-12-01 LAB — ANTI-SCLERODERMA ANTIBODY: Scleroderma (Scl-70) (ENA) Antibody, IgG: <1 AI

## 2019-12-01 LAB — SJOGREN'S SYNDROME ANTIBODS(SSA + SSB)
SSA (Ro) (ENA) Antibody, IgG: <1 AI
SSB (La) (ENA) Antibody, IgG: <1 AI

## 2019-12-01 LAB — C3 AND C4
C3 Complement: 128 mg/dL (ref 82–185)
C4 Complement: 20 mg/dL (ref 15–53)

## 2019-12-01 LAB — ANTI-DNA ANTIBODY, DOUBLE-STRANDED: ds DNA Ab: 2 IU/mL

## 2019-12-01 LAB — TEST AUTHORIZATION

## 2019-12-01 LAB — ANTI-NUCLEAR AB-TITER (ANA TITER): ANA Titer 1: 1:640 {titer} — ABNORMAL HIGH

## 2019-12-01 LAB — TSH: TSH: 2.84 mIU/L (ref 0.40–4.50)

## 2019-12-01 LAB — ANA: Anti Nuclear Antibody (ANA): POSITIVE — AB

## 2019-12-01 LAB — CK: Total CK: 193 U/L (ref 44–196)

## 2019-12-01 LAB — VITAMIN D 25 HYDROXY (VIT D DEFICIENCY, FRACTURES): Vit D, 25-Hydroxy: 25 ng/mL — ABNORMAL LOW (ref 30–100)

## 2019-12-01 LAB — MAGNESIUM: Magnesium: 2.3 mg/dL (ref 1.5–2.5)

## 2019-12-01 LAB — ALDOLASE: Aldolase: 4.5 U/L (ref <=8.1)

## 2019-12-01 LAB — SEDIMENTATION RATE: Sed Rate: 2 mm/h (ref 0–20)

## 2019-12-11 ENCOUNTER — Ambulatory Visit: Payer: PPO | Admitting: Rheumatology

## 2019-12-11 ENCOUNTER — Encounter: Payer: Self-pay | Admitting: Rheumatology

## 2019-12-11 ENCOUNTER — Other Ambulatory Visit: Payer: Self-pay

## 2019-12-11 VITALS — BP 103/68 | HR 71 | Resp 16 | Ht 74.0 in | Wt 188.4 lb

## 2019-12-11 DIAGNOSIS — R748 Abnormal levels of other serum enzymes: Secondary | ICD-10-CM | POA: Diagnosis not present

## 2019-12-11 DIAGNOSIS — M791 Myalgia, unspecified site: Secondary | ICD-10-CM

## 2019-12-11 DIAGNOSIS — I10 Essential (primary) hypertension: Secondary | ICD-10-CM

## 2019-12-11 DIAGNOSIS — I447 Left bundle-branch block, unspecified: Secondary | ICD-10-CM

## 2019-12-11 DIAGNOSIS — K760 Fatty (change of) liver, not elsewhere classified: Secondary | ICD-10-CM

## 2019-12-11 DIAGNOSIS — I420 Dilated cardiomyopathy: Secondary | ICD-10-CM

## 2019-12-11 DIAGNOSIS — M5136 Other intervertebral disc degeneration, lumbar region: Secondary | ICD-10-CM

## 2019-12-11 DIAGNOSIS — E1122 Type 2 diabetes mellitus with diabetic chronic kidney disease: Secondary | ICD-10-CM | POA: Diagnosis not present

## 2019-12-11 DIAGNOSIS — M19042 Primary osteoarthritis, left hand: Secondary | ICD-10-CM

## 2019-12-11 DIAGNOSIS — W57XXXS Bitten or stung by nonvenomous insect and other nonvenomous arthropods, sequela: Secondary | ICD-10-CM

## 2019-12-11 DIAGNOSIS — M19041 Primary osteoarthritis, right hand: Secondary | ICD-10-CM | POA: Diagnosis not present

## 2019-12-11 DIAGNOSIS — E7800 Pure hypercholesterolemia, unspecified: Secondary | ICD-10-CM | POA: Diagnosis not present

## 2019-12-11 DIAGNOSIS — G4733 Obstructive sleep apnea (adult) (pediatric): Secondary | ICD-10-CM

## 2019-12-11 DIAGNOSIS — E559 Vitamin D deficiency, unspecified: Secondary | ICD-10-CM

## 2019-12-14 NOTE — Progress Notes (Signed)
Cardiology Office Note   Date:  12/18/2019   ID:  Jeffery Stone, DOB January 23, 1956, MRN [id number]  PCP:  Leighton Ruff, MD  Cardiologist:   Peter Martinique, MD   Chief Complaint  Patient presents with  . Congestive Heart Failure      History of Present Illness: Jeffery Stone is a 64 y.o. male who is seen for follow up of cardiomyopathy and chest pain. He has a history of DM type 2, HLD, HTN and LBBB. He has a history of OSA. Aortic atherosclerosis noted on CT.  Apparently has cardiomyopathy with EF 40-45%. Myoview perfusion study in 2006 showed no ischemia.   Recently he was seen for a routine physical. Noted some symptoms of chest pain around his ribs R>L. Noted more when doing yard work. On physical labs showed an Elevated CPK to 484. He was sent to the ED where troponin levels were normal x 2. Ecg showed his chronic LBBB. He states his CPK went down to 216 but then back up again. Simvastatin held. Has been seen by Dr Bronson Curb with no clear etiology found.   To further evaluate he had Myoview study in April showing a fixed septal perfusion abnormality felt to be c/w LBBB artifact. EF 42%. Subsequent Echo showed EF 35-40%. Global HK. Recommended starting on Entresto. He is tolerating this well. Still denies any chest pain, dyspnea. He notes his BP is low at times but he denies any dizziness of lightheadedness. No edema.     Past Medical History:  Diagnosis Date  . Diabetes mellitus    type 2  . Hypercholesterolemia   . Hypertension   . LBBB (left bundle branch block)   . Nonischemic dilated cardiomyopathy (HCC)    Est. EF of 40-45%  . Sleep apnea     Past Surgical History:  Procedure Laterality Date  . BACK SURGERY       Current Outpatient Medications  Medication Sig Dispense Refill  . aspirin EC 81 MG tablet Take 81 mg by mouth daily.    [name] Microlet Lancets lancets USE TO TEST BLOOD SUGAR ONCE DAILY E11.9    . carvedilol (COREG) 6.25 MG tablet Take 6.25 mg by mouth  2 (two) times daily with a meal.      . ezetimibe (ZETIA) 10 MG tablet Take 1 tablet (10 mg total) by mouth daily. 30 tablet 6  . fenofibrate (TRICOR) 145 MG tablet Take 145 mg by mouth daily.      . multivitamin (THERAGRAN) per tablet Take 1 tablet by mouth daily.      . Omega-3 Fatty Acids (FISH OIL PO) Take 1 capsule by mouth daily.      . sacubitril-valsartan (ENTRESTO) 49-51 MG Take 1 tablet by mouth 2 (two) times daily. 60 tablet 6  . sildenafil (VIAGRA) 100 MG tablet TAKE 1/2 TABLET ONCE DAILY IF NEEDED    . Triprolidine-Pseudoephedrine (ANTIHISTAMINE PO) Take by mouth daily.    . Vitamin D, Ergocalciferol, (DRISDOL) 1.25 MG (50000 UNIT) CAPS capsule Take 1 capsule (50,000 Units total) by mouth every 7 (seven) days. 12 capsule 0  . ONETOUCH ULTRA test strip daily.     No current facility-administered medications for this visit.    Allergies:   Poison ivy extract    Social History:  The patient  reports that he has never smoked. He has never used smokeless tobacco. He reports that he does not drink alcohol and does not use drugs.   Family History:  The patient's  family history includes Cancer in his father; Dementia in his mother; Diabetes in his father; Healthy in his son and son; Heart attack in his brother; Hypertension in his father and mother; Liver cancer in his mother; Stroke in his mother; Transient ischemic attack in his mother.    ROS:  Please see the history of present illness.   Otherwise, review of systems are positive for none.   All other systems are reviewed and negative.    PHYSICAL EXAM: VS:  BP 100/60 (BP Location: Right Arm, Cuff Size: Normal)   Pulse 74   Ht 6\' 2"  (1.88 m)   Wt 185 lb (83.9 kg)   SpO2 99%   BMI 23.75 kg/m  , BMI Body mass index is 23.75 kg/m. GEN: Well nourished, well developed, in no acute distress  HEENT: normal  Neck: no JVD, carotid bruits, or masses Cardiac: RRR; no murmurs, rubs, or gallops,no edema  Respiratory:  clear to  auscultation bilaterally, normal work of breathing GI: soft, nontender, nondistended, + BS MS: no deformity or atrophy  Skin: warm and dry, multiple chigger bites on LE Neuro:  Strength and sensation are intact Psych: euthymic mood, full affect   EKG:  EKG is not ordered today.   Recent Labs: 07/06/2019: Hemoglobin 12.0; Platelets 250 09/29/2019: ALT 18; BUN 31; Creatinine, Ser 1.21; Potassium 4.6; Sodium 139 11/16/2019: Magnesium 2.3; TSH 2.84    Lipid Panel    Component Value Date/Time   CHOL 179 09/29/2019 0823   TRIG 109 09/29/2019 0823   HDL 30 (L) 09/29/2019 0823   CHOLHDL 6.0 (H) 09/29/2019 0823   LDLCALC 129 (H) 09/29/2019 0823     Dated 07/06/19: cholesterol 136, triglycerides 81, HDL 32, LDL 71. A1c 6%. Creatinine 1.2.. Hgb 12. Otherwise CMET and CBC normal.    Wt Readings from Last 3 Encounters:  12/18/19 185 lb (83.9 kg)  12/11/19 188 lb 6.4 oz (85.5 kg)  11/16/19 189 lb 12.8 oz (86.1 kg)      Other studies Reviewed: Additional studies/ records that were reviewed today include:   Myoview 08/16/19: Study Highlights    The left ventricular ejection fraction is moderately decreased (30-44%).  Nuclear stress EF: 42%.  There was no ST segment deviation noted during stress.  Defect 1: There is a large defect of mild severity present in the mid anteroseptal, mid inferoseptal and apical septal location.  This is an intermediate risk study.   Intermediate risk stress nuclear study due to moderately reduced left ventricular systolic function. There is a fixed septal perfusion defect consistent with LBBB-related artifact. No reversible ischemia is seen.    Echo 08/21/19: IMPRESSIONS    1. Left ventricular ejection fraction, by estimation, is 35 to 40%. The  left ventricle has moderately decreased function. The left ventricle  demonstrates global hypokinesis. The left ventricular internal cavity size  was mildly dilated. Left ventricular  diastolic  parameters are indeterminate.  2. Right ventricular systolic function is normal. The right ventricular  size is normal.  3. The mitral valve is normal in structure. Mild mitral valve  regurgitation.  4. The aortic valve is normal in structure. Aortic valve regurgitation is  not visualized.    ASSESSMENT AND PLAN:  1.  Atypical chest pain. No ischemia noted on Myoview. Findings of fixed defect c/w LBBB artifact.  2. History of nonischemic CM with EF 40-45%. Currently class 1. . Was on  ARB and Coreg. I suspect this is at least partly related to dyssynchrony with his  LBBB. Also may be related to HTN and DM. Now EF 42% by Myoview and 35-40% by Echo. ARB switched to [facility]. Unable to titrate further due to low BP. Will continue current therapy.  3. Elevated CPK. Not cardiac. Now off statin. Seen by Rheumatology.  4. LBBB 5. DM type 2 6. HTN- now BP low.  7. HLD off statin due to elevated CPK. On fenofibrate and Zetia.  8. Aortic atherosclerosis. Noted on CT.    Current medicines are reviewed at length with the patient today.  The patient has no concerns regarding medicines.  The following changes have been made:  no change  Labs/ tests ordered today include:   No orders of the defined types were placed in this encounter.    Disposition:   FU with me 6 months  Signed, Peter Martinique, MD  12/18/2019 8:13 AM    [facility] [address] [place], [place], [address] Phone [phone], Fax [phone]

## 2019-12-18 ENCOUNTER — Encounter: Payer: Self-pay | Admitting: Cardiology

## 2019-12-18 ENCOUNTER — Other Ambulatory Visit: Payer: Self-pay

## 2019-12-18 ENCOUNTER — Ambulatory Visit: Payer: PPO | Admitting: Cardiology

## 2019-12-18 VITALS — BP 100/60 | HR 74 | Ht 74.0 in | Wt 185.0 lb

## 2019-12-18 DIAGNOSIS — I420 Dilated cardiomyopathy: Secondary | ICD-10-CM | POA: Diagnosis not present

## 2019-12-18 DIAGNOSIS — E785 Hyperlipidemia, unspecified: Secondary | ICD-10-CM

## 2019-12-18 DIAGNOSIS — E7800 Pure hypercholesterolemia, unspecified: Secondary | ICD-10-CM

## 2019-12-18 DIAGNOSIS — I700 Atherosclerosis of aorta: Secondary | ICD-10-CM | POA: Diagnosis not present

## 2020-02-04 ENCOUNTER — Other Ambulatory Visit: Payer: Self-pay | Admitting: Physician Assistant

## 2020-02-05 NOTE — Telephone Encounter (Signed)
Patient advised he will need to have his Vitamin D rechecked prior to refill.

## 2020-02-09 ENCOUNTER — Other Ambulatory Visit: Payer: Self-pay

## 2020-02-09 DIAGNOSIS — E559 Vitamin D deficiency, unspecified: Secondary | ICD-10-CM | POA: Diagnosis not present

## 2020-02-10 LAB — VITAMIN D 25 HYDROXY (VIT D DEFICIENCY, FRACTURES): Vit D, 25-Hydroxy: 76 ng/mL (ref 30–100)

## 2020-02-10 NOTE — Progress Notes (Signed)
Vitamin D is normal now.  Please advise patient to take over-the-counter vitamin D 1000 units daily.

## 2020-03-18 DIAGNOSIS — I10 Essential (primary) hypertension: Secondary | ICD-10-CM | POA: Diagnosis not present

## 2020-03-18 DIAGNOSIS — E1122 Type 2 diabetes mellitus with diabetic chronic kidney disease: Secondary | ICD-10-CM | POA: Diagnosis not present

## 2020-03-18 DIAGNOSIS — E7800 Pure hypercholesterolemia, unspecified: Secondary | ICD-10-CM | POA: Diagnosis not present

## 2020-03-18 DIAGNOSIS — N183 Chronic kidney disease, stage 3 unspecified: Secondary | ICD-10-CM | POA: Diagnosis not present

## 2020-03-18 DIAGNOSIS — E782 Mixed hyperlipidemia: Secondary | ICD-10-CM | POA: Diagnosis not present

## 2020-03-23 ENCOUNTER — Other Ambulatory Visit: Payer: Self-pay | Admitting: Cardiology

## 2020-03-29 ENCOUNTER — Other Ambulatory Visit: Payer: Self-pay | Admitting: Cardiology

## 2020-06-27 ENCOUNTER — Other Ambulatory Visit: Payer: Self-pay

## 2020-06-27 ENCOUNTER — Ambulatory Visit (INDEPENDENT_AMBULATORY_CARE_PROVIDER_SITE_OTHER): Payer: PPO | Admitting: Cardiology

## 2020-06-27 ENCOUNTER — Encounter: Payer: Self-pay | Admitting: Cardiology

## 2020-06-27 VITALS — BP 120/82 | HR 80 | Ht 74.0 in | Wt 189.6 lb

## 2020-06-27 DIAGNOSIS — E7800 Pure hypercholesterolemia, unspecified: Secondary | ICD-10-CM | POA: Diagnosis not present

## 2020-06-27 DIAGNOSIS — I447 Left bundle-branch block, unspecified: Secondary | ICD-10-CM | POA: Diagnosis not present

## 2020-06-27 DIAGNOSIS — I10 Essential (primary) hypertension: Secondary | ICD-10-CM | POA: Diagnosis not present

## 2020-06-27 DIAGNOSIS — I420 Dilated cardiomyopathy: Secondary | ICD-10-CM

## 2020-06-27 MED ORDER — ENTRESTO 49-51 MG PO TABS
1.0000 | ORAL_TABLET | Freq: Two times a day (BID) | ORAL | 3 refills | Status: DC
Start: 2020-06-27 — End: 2021-07-31

## 2020-06-27 NOTE — Progress Notes (Signed)
Cardiology Office Note   Date:  06/27/2020   ID:  Jeffery Stone, DOB 1955/06/11, MRN [id number]  PCP:  Leighton Ruff, MD  Cardiologist:   Jadaya Sommerfield Martinique, MD   No chief complaint on file.     History of Present Illness: Jeffery Stone is a 65 y.o. male who is seen for follow up of cardiomyopathy and chest pain. He has a history of DM type 2, HLD, HTN and LBBB. He has a history of OSA. Aortic atherosclerosis noted on CT.  Apparently has cardiomyopathy with EF 40-45%. Myoview perfusion study in 2006 showed no ischemia.   Recently he was seen for a routine physical. Noted some symptoms of chest pain around his ribs R>L. Noted more when doing yard work. On physical labs showed an Elevated CPK to 484. He was sent to the ED where troponin levels were normal x 2. Ecg showed his chronic LBBB. He states his CPK went down to 216 but then back up again. Simvastatin held. Has been seen by Dr Bronson Curb with no clear etiology found.   To further evaluate he had Myoview study in April showing a fixed septal perfusion abnormality felt to be c/w LBBB artifact. EF 42%. Subsequent Echo showed EF 35-40%. Global HK. Recommended starting on Entresto. He is tolerating this well. Still denies any chest pain, dyspnea. He notes some dizziness if he stands too quickly after sitting for a while. No edema. He did have Covid in January but recovered well.     Past Medical History:  Diagnosis Date  . Diabetes mellitus    type 2  . Hypercholesterolemia   . Hypertension   . LBBB (left bundle branch block)   . Nonischemic dilated cardiomyopathy (HCC)    Est. EF of 40-45%  . Sleep apnea     Past Surgical History:  Procedure Laterality Date  . BACK SURGERY       Current Outpatient Medications  Medication Sig Dispense Refill  . aspirin EC 81 MG tablet Take 81 mg by mouth daily.    [name] Microlet Lancets lancets USE TO TEST BLOOD SUGAR ONCE DAILY E11.9    . calcium-vitamin D (OSCAL WITH D) 500-200 MG-UNIT  tablet Take 1 tablet by mouth.    . carvedilol (COREG) 6.25 MG tablet Take 6.25 mg by mouth 2 (two) times daily with a meal.      . ENTRESTO 49-51 MG TAKE 1 TABLET BY MOUTH 2 (TWO) TIMES DAILY. 60 tablet 6  . ezetimibe (ZETIA) 10 MG tablet TAKE 1 TABLET BY MOUTH EVERY DAY 90 tablet 3  . fenofibrate (TRICOR) 145 MG tablet Take 145 mg by mouth daily.      . Ferrous Sulfate (IRON) 325 (65 Fe) MG TABS 1 tablet    . Ibuprofen-diphenhydrAMINE Cit (ADVIL PM) 200-38 MG TABS Take 200 mg by mouth as needed.    . multivitamin (THERAGRAN) per tablet Take 1 tablet by mouth daily.      . Omega-3 Fatty Acids (FISH OIL PO) Take 1 capsule by mouth daily.      Glory Rosebush ULTRA test strip daily.    . sildenafil (VIAGRA) 100 MG tablet TAKE 1/2 TABLET ONCE DAILY IF NEEDED    . Triprolidine-Pseudoephedrine (ANTIHISTAMINE PO) Take by mouth daily.     No current facility-administered medications for this visit.    Allergies:   Poison ivy extract    Social History:  The patient  reports that he has never smoked. He has never used smokeless tobacco.  He reports that he does not drink alcohol and does not use drugs.   Family History:  The patient's family history includes Cancer in his father; Dementia in his mother; Diabetes in his father; Healthy in his son and son; Heart attack in his brother; Hypertension in his father and mother; Liver cancer in his mother; Stroke in his mother; Transient ischemic attack in his mother.    ROS:  Please see the history of present illness.   Otherwise, review of systems are positive for none.   All other systems are reviewed and negative.    PHYSICAL EXAM: VS:  BP 120/82   Pulse 80   Ht 6\' 2"  (1.88 m)   Wt 189 lb 9.6 oz (86 kg)   SpO2 97%   BMI 24.34 kg/m  , BMI Body mass index is 24.34 kg/m. GEN: Well nourished, well developed, in no acute distress  HEENT: normal  Neck: no JVD, carotid bruits, or masses Cardiac: RRR; no murmurs, rubs, or gallops,no edema  Respiratory:   clear to auscultation bilaterally, normal work of breathing GI: soft, nontender, nondistended, + BS MS: no deformity or atrophy  Skin: warm and dry, multiple chigger bites on LE Neuro:  Strength and sensation are intact Psych: euthymic mood, full affect   EKG:  EKG is not ordered today.   Recent Labs: 07/06/2019: Hemoglobin 12.0; Platelets 250 09/29/2019: ALT 18; BUN 31; Creatinine, Ser 1.21; Potassium 4.6; Sodium 139 11/16/2019: Magnesium 2.3; TSH 2.84    Lipid Panel    Component Value Date/Time   CHOL 179 09/29/2019 0823   TRIG 109 09/29/2019 0823   HDL 30 (L) 09/29/2019 0823   CHOLHDL 6.0 (H) 09/29/2019 0823   LDLCALC 129 (H) 09/29/2019 0823     Dated 07/06/19: cholesterol 136, triglycerides 81, HDL 32, LDL 71. A1c 6%. Creatinine 1.2.. Hgb 12. Otherwise CMET and CBC normal.    Wt Readings from Last 3 Encounters:  06/27/20 189 lb 9.6 oz (86 kg)  12/18/19 185 lb (83.9 kg)  12/11/19 188 lb 6.4 oz (85.5 kg)      Other studies Reviewed: Additional studies/ records that were reviewed today include:   Myoview 08/16/19: Study Highlights    The left ventricular ejection fraction is moderately decreased (30-44%).  Nuclear stress EF: 42%.  There was no ST segment deviation noted during stress.  Defect 1: There is a large defect of mild severity present in the mid anteroseptal, mid inferoseptal and apical septal location.  This is an intermediate risk study.   Intermediate risk stress nuclear study due to moderately reduced left ventricular systolic function. There is a fixed septal perfusion defect consistent with LBBB-related artifact. No reversible ischemia is seen.    Echo 08/21/19: IMPRESSIONS    1. Left ventricular ejection fraction, by estimation, is 35 to 40%. The  left ventricle has moderately decreased function. The left ventricle  demonstrates global hypokinesis. The left ventricular internal cavity size  was mildly dilated. Left ventricular  diastolic  parameters are indeterminate.  2. Right ventricular systolic function is normal. The right ventricular  size is normal.  3. The mitral valve is normal in structure. Mild mitral valve  regurgitation.  4. The aortic valve is normal in structure. Aortic valve regurgitation is  not visualized.    ASSESSMENT AND PLAN:  1.  Atypical chest pain. No ischemia noted on Myoview. Findings of fixed defect c/w LBBB artifact.  2. History of nonischemic CM with EF 40-45%. Currently class 1. I suspect this is  at least partly related to dyssynchrony with his LBBB. Also may be related to HTN and DM. Now EF 42% by Myoview and 35-40% by Echo. ARB switched to [facility]. Unable to titrate further due to low BP. On Coreg.  3. Elevated CPK. Not cardiac. Now off statin. Seen by Rheumatology.  4. LBBB 5. DM type 2 6. HTN- controlled.   7. HLD off statin due to elevated CPK. On fenofibrate and Zetia. Due for follow up lab work with PCP.  8. Aortic atherosclerosis. Noted on CT.    Current medicines are reviewed at length with the patient today.  The patient has no concerns regarding medicines.  The following changes have been made:  no change  Labs/ tests ordered today include:   No orders of the defined types were placed in this encounter.    Disposition:   FU with me one year.  Signed, Makisha Marrin Martinique, MD  06/27/2020 11:45 AM    [facility] [address] [place], [place], [address] Phone [phone], Fax [phone]

## 2020-07-15 DIAGNOSIS — Z125 Encounter for screening for malignant neoplasm of prostate: Secondary | ICD-10-CM | POA: Diagnosis not present

## 2020-07-15 DIAGNOSIS — G4733 Obstructive sleep apnea (adult) (pediatric): Secondary | ICD-10-CM | POA: Diagnosis not present

## 2020-07-15 DIAGNOSIS — I447 Left bundle-branch block, unspecified: Secondary | ICD-10-CM | POA: Diagnosis not present

## 2020-07-15 DIAGNOSIS — Z0000 Encounter for general adult medical examination without abnormal findings: Secondary | ICD-10-CM | POA: Diagnosis not present

## 2020-07-15 DIAGNOSIS — I10 Essential (primary) hypertension: Secondary | ICD-10-CM | POA: Diagnosis not present

## 2020-07-15 DIAGNOSIS — N183 Chronic kidney disease, stage 3 unspecified: Secondary | ICD-10-CM | POA: Diagnosis not present

## 2020-07-15 DIAGNOSIS — E1169 Type 2 diabetes mellitus with other specified complication: Secondary | ICD-10-CM | POA: Diagnosis not present

## 2020-07-15 DIAGNOSIS — E7800 Pure hypercholesterolemia, unspecified: Secondary | ICD-10-CM | POA: Diagnosis not present

## 2020-07-15 DIAGNOSIS — I420 Dilated cardiomyopathy: Secondary | ICD-10-CM | POA: Diagnosis not present

## 2020-07-29 NOTE — Progress Notes (Signed)
Office Visit Note  Patient: Jeffery Stone             Date of Birth: 1955-11-07           MRN: [id number]             PCP: Leighton Ruff, MD Referring: Leighton Ruff, MD Visit Date: 08/12/2020 Occupation: @GUAROCC @  Subjective:  Positive ANA and elevated muscle enzymes.   History of Present Illness: Jeffery Stone is a 65 y.o. male was initially seen for evaluation of elevated CK.  His repeat CK was normal but his ANA was positive.  He had no clinical features of autoimmune disease.  He also has history of vitamin D deficiency.  He has been taking vitamin D 1000 units daily.  He states he was doing some yard work and developed some muscle discomfort in his left deltoid and his left thigh but denies any muscle weakness.  He also gets frequent tick bites and sometimes get a rash after that.  He states his blood pressure has been running low and he will contact his cardiologist.  Activities of Daily Living:  Patient reports morning stiffness for  15-20  minutes.   Patient Reports nocturnal pain.  Difficulty dressing/grooming: Denies Difficulty climbing stairs: Denies Difficulty getting out of chair: Denies Difficulty using hands for taps, buttons, cutlery, and/or writing: Denies  Review of Systems  Constitutional: Negative for fatigue.  HENT: Negative for mouth sores, mouth dryness and nose dryness.   Eyes: Negative for pain, itching and dryness.  Respiratory: Negative for shortness of breath and difficulty breathing.   Cardiovascular: Negative for chest pain and palpitations.  Gastrointestinal: Negative for blood in stool, constipation and diarrhea.  Endocrine: Negative for increased urination.  Genitourinary: Negative for difficulty urinating.  Musculoskeletal: Positive for myalgias, morning stiffness and myalgias. Negative for arthralgias, joint pain, joint swelling and muscle tenderness.  Skin: Positive for rash. Negative for color change and redness.  Allergic/Immunologic:  Negative for susceptible to infections.  Neurological: Positive for dizziness. Negative for numbness, headaches, memory loss and weakness.  Hematological: Negative for bruising/bleeding tendency.  Psychiatric/Behavioral: Negative for confusion.    PMFS History:  Patient Active Problem List   Diagnosis Date Noted  . Nonischemic dilated cardiomyopathy ([place])   . Left bundle branch block 01/14/2011  . Hypertension 01/14/2011  . Hypercholesterolemia 01/14/2011  . Diabetes mellitus ([place]) 01/14/2011    Past Medical History:  Diagnosis Date  . Diabetes mellitus    type 2  . Hypercholesterolemia   . Hypertension   . LBBB (left bundle branch block)   . Nonischemic dilated cardiomyopathy ([facility])    Est. EF of 40-45%  . Sleep apnea     Family History  Problem Relation Age of Onset  . Diabetes Father   . Hypertension Father        ?  Marland Kitchen Cancer Father        kidney  . Hypertension Mother   . Stroke Mother   . Transient ischemic attack Mother        multiple  . Dementia Mother   . Liver cancer Mother   . Heart attack Brother   . Healthy Son   . Healthy Son    Past Surgical History:  Procedure Laterality Date  . BACK SURGERY     Social History   Social History Narrative  . Not on file   Immunization History  Administered Date(s) Administered  . Influenza,inj,Quad PF,6+ Mos 02/08/2013  . PFIZER(Purple Top)SARS-COV-2 Vaccination 07/10/2019, 07/31/2019,  02/11/2020     Objective: Vital Signs: BP 97/62 (BP Location: Right Arm, Patient Position: Sitting, Cuff Size: Normal)   Pulse 65   Resp 16   Ht 6\' 2"  (1.88 m)   Wt 192 lb 3.2 oz (87.2 kg)   BMI 24.68 kg/m    Physical Exam Vitals and nursing note reviewed.  Constitutional:      Appearance: He is well-developed.  HENT:     Head: Normocephalic and atraumatic.  Eyes:     Conjunctiva/sclera: Conjunctivae normal.     Pupils: Pupils are equal, round, and reactive to light.  Cardiovascular:     Rate and Rhythm: Normal  rate and regular rhythm.     Heart sounds: Normal heart sounds.  Pulmonary:     Effort: Pulmonary effort is normal.     Breath sounds: Normal breath sounds.  Abdominal:     General: Bowel sounds are normal.     Palpations: Abdomen is soft.  Musculoskeletal:     Cervical back: Normal range of motion and neck supple.  Skin:    General: Skin is warm and dry.     Capillary Refill: Capillary refill takes less than 2 seconds.  Neurological:     Mental Status: He is alert and oriented to person, place, and time.  Psychiatric:        Behavior: Behavior normal.      Musculoskeletal Exam: C-spine thoracic and lumbar spine were in good range of motion.  Shoulder joints, elbow joints, wrist joints, MCPs PIPs and DIPs with good range of motion with no synovitis.  He has bilateral PIP and DIP thickening consistent with osteoarthritis.  Hip joints, knee joints, ankles, MTPs with good range of motion with no synovitis.  He had no muscular weakness or tenderness.  He had no difficulty getting up from the squatting position.  CDAI Exam: CDAI Score: -- Patient Global: --; Provider Global: -- Swollen: --; Tender: -- Joint Exam 08/12/2020   No joint exam has been documented for this visit   There is currently no information documented on the homunculus. Go to the Rheumatology activity and complete the homunculus joint exam.  Investigation: No additional findings.  Imaging: No results found.  Recent Labs: Lab Results  Component Value Date   WBC 6.5 07/06/2019   HGB 12.0 (L) 07/06/2019   PLT 250 07/06/2019   NA 139 09/29/2019   K 4.6 09/29/2019   CL 103 09/29/2019   CO2 20 09/29/2019   GLUCOSE 90 09/29/2019   BUN 31 (H) 09/29/2019   CREATININE 1.21 09/29/2019   BILITOT 0.3 09/29/2019   ALKPHOS 38 (L) 09/29/2019   AST 25 09/29/2019   ALT 18 09/29/2019   PROT 7.2 09/29/2019   ALBUMIN 4.7 09/29/2019   CALCIUM 9.2 09/29/2019   GFRAA 72 09/29/2019    Speciality Comments: No specialty  comments available.  Procedures:  No procedures performed Allergies: Poison ivy extract   Assessment / Plan:     Visit Diagnoses: Elevated CPK - Repeat CK normal, aldolase normal, Jo 1 -, ANA 1: 640 speckled, ENA negative, complements normal.  He has no clinical features of autoimmune disease.  Repeating ANA is not necessary at this point.  I have advised him to contact me in case he develops new symptoms.  He had no muscular weakness or tenderness on my examination today.  He has some discomfort in his left deltoid and left thigh after doing yard work.  He had no difficulty getting up from the squatting  position and had good strength in all 4 extremities.  Myalgia-only after doing yard work and isolate with muscle groups.  He does not have any generalized myalgias.  Vitamin D deficiency -he has history of vitamin D deficiency.  He was treated with vitamin D 50,000 units once a week for 3 months initially and then he has been maintaining on vitamin D 1000 units a day.  We will check vitamin D level today.  We will contact him with the lab results when available.  Plan: VITAMIN D 25 Hydroxy (Vit-D Deficiency, Fractures)  Primary osteoarthritis of both hands-he has PIP and DIP thickening.  He has some stiffness in his hands but no joint swelling.  DDD (degenerative disc disease), lumbar - Discectomy 1995.  Other medical problems are listed as follows:  Essential hypertension-his blood pressure has been running low.  He will contact the cardiologist to adjust medications.  LBBB (left bundle branch block)  Nonischemic dilated cardiomyopathy ([facility])  Chronic kidney disease due to type 2 diabetes mellitus ([place])  Hypercholesterolemia - Treated with simvastatin for 15 years.  He is on Zetia now.  Hepatic steatosis  OSA (obstructive sleep apnea)  Tick bite, sequela  Orders: Orders Placed This Encounter  Procedures  . VITAMIN D 25 Hydroxy (Vit-D Deficiency, Fractures)   No orders of the  defined types were placed in this encounter.   Follow-Up Instructions: Return in about 1 year (around 08/12/2021) for +ANA,.   Bo Merino, MD  Note - This record has been created using Editor, commissioning.  Chart creation errors have been sought, but may not always  have been located. Such creation errors do not reflect on  the standard of medical care.

## 2020-08-12 ENCOUNTER — Ambulatory Visit: Payer: PPO | Admitting: Rheumatology

## 2020-08-12 ENCOUNTER — Encounter: Payer: Self-pay | Admitting: Rheumatology

## 2020-08-12 ENCOUNTER — Other Ambulatory Visit: Payer: Self-pay

## 2020-08-12 VITALS — BP 97/62 | HR 65 | Resp 16 | Ht 74.0 in | Wt 192.2 lb

## 2020-08-12 DIAGNOSIS — I447 Left bundle-branch block, unspecified: Secondary | ICD-10-CM

## 2020-08-12 DIAGNOSIS — K760 Fatty (change of) liver, not elsewhere classified: Secondary | ICD-10-CM | POA: Diagnosis not present

## 2020-08-12 DIAGNOSIS — G4733 Obstructive sleep apnea (adult) (pediatric): Secondary | ICD-10-CM | POA: Diagnosis not present

## 2020-08-12 DIAGNOSIS — I10 Essential (primary) hypertension: Secondary | ICD-10-CM

## 2020-08-12 DIAGNOSIS — M19041 Primary osteoarthritis, right hand: Secondary | ICD-10-CM | POA: Diagnosis not present

## 2020-08-12 DIAGNOSIS — I420 Dilated cardiomyopathy: Secondary | ICD-10-CM | POA: Diagnosis not present

## 2020-08-12 DIAGNOSIS — E7800 Pure hypercholesterolemia, unspecified: Secondary | ICD-10-CM | POA: Diagnosis not present

## 2020-08-12 DIAGNOSIS — M5136 Other intervertebral disc degeneration, lumbar region: Secondary | ICD-10-CM

## 2020-08-12 DIAGNOSIS — E559 Vitamin D deficiency, unspecified: Secondary | ICD-10-CM

## 2020-08-12 DIAGNOSIS — M791 Myalgia, unspecified site: Secondary | ICD-10-CM | POA: Diagnosis not present

## 2020-08-12 DIAGNOSIS — R748 Abnormal levels of other serum enzymes: Secondary | ICD-10-CM | POA: Diagnosis not present

## 2020-08-12 DIAGNOSIS — E1122 Type 2 diabetes mellitus with diabetic chronic kidney disease: Secondary | ICD-10-CM | POA: Diagnosis not present

## 2020-08-12 DIAGNOSIS — M19042 Primary osteoarthritis, left hand: Secondary | ICD-10-CM

## 2020-08-12 DIAGNOSIS — W57XXXS Bitten or stung by nonvenomous insect and other nonvenomous arthropods, sequela: Secondary | ICD-10-CM

## 2020-08-12 DIAGNOSIS — M51369 Other intervertebral disc degeneration, lumbar region without mention of lumbar back pain or lower extremity pain: Secondary | ICD-10-CM

## 2020-08-12 LAB — VITAMIN D 25 HYDROXY (VIT D DEFICIENCY, FRACTURES): Vit D, 25-Hydroxy: 45 ng/mL (ref 30–100)

## 2020-08-13 NOTE — Progress Notes (Signed)
Vitamin D is normal.  He should continue to take vitamin D 1000 units daily.

## 2020-08-21 DIAGNOSIS — I10 Essential (primary) hypertension: Secondary | ICD-10-CM | POA: Diagnosis not present

## 2020-08-21 DIAGNOSIS — N183 Chronic kidney disease, stage 3 unspecified: Secondary | ICD-10-CM | POA: Diagnosis not present

## 2020-08-21 DIAGNOSIS — E7800 Pure hypercholesterolemia, unspecified: Secondary | ICD-10-CM | POA: Diagnosis not present

## 2020-08-21 DIAGNOSIS — E1169 Type 2 diabetes mellitus with other specified complication: Secondary | ICD-10-CM | POA: Diagnosis not present

## 2020-08-21 DIAGNOSIS — E1122 Type 2 diabetes mellitus with diabetic chronic kidney disease: Secondary | ICD-10-CM | POA: Diagnosis not present

## 2020-08-21 DIAGNOSIS — E782 Mixed hyperlipidemia: Secondary | ICD-10-CM | POA: Diagnosis not present

## 2020-09-16 DIAGNOSIS — E1169 Type 2 diabetes mellitus with other specified complication: Secondary | ICD-10-CM | POA: Diagnosis not present

## 2020-09-16 DIAGNOSIS — E1122 Type 2 diabetes mellitus with diabetic chronic kidney disease: Secondary | ICD-10-CM | POA: Diagnosis not present

## 2020-09-16 DIAGNOSIS — E782 Mixed hyperlipidemia: Secondary | ICD-10-CM | POA: Diagnosis not present

## 2020-09-16 DIAGNOSIS — I10 Essential (primary) hypertension: Secondary | ICD-10-CM | POA: Diagnosis not present

## 2020-09-16 DIAGNOSIS — N183 Chronic kidney disease, stage 3 unspecified: Secondary | ICD-10-CM | POA: Diagnosis not present

## 2020-09-16 DIAGNOSIS — E7800 Pure hypercholesterolemia, unspecified: Secondary | ICD-10-CM | POA: Diagnosis not present

## 2020-11-03 DIAGNOSIS — I10 Essential (primary) hypertension: Secondary | ICD-10-CM | POA: Diagnosis not present

## 2020-11-03 DIAGNOSIS — E7800 Pure hypercholesterolemia, unspecified: Secondary | ICD-10-CM | POA: Diagnosis not present

## 2020-11-03 DIAGNOSIS — E1122 Type 2 diabetes mellitus with diabetic chronic kidney disease: Secondary | ICD-10-CM | POA: Diagnosis not present

## 2020-11-03 DIAGNOSIS — N183 Chronic kidney disease, stage 3 unspecified: Secondary | ICD-10-CM | POA: Diagnosis not present

## 2020-11-03 DIAGNOSIS — E1169 Type 2 diabetes mellitus with other specified complication: Secondary | ICD-10-CM | POA: Diagnosis not present

## 2020-11-03 DIAGNOSIS — E782 Mixed hyperlipidemia: Secondary | ICD-10-CM | POA: Diagnosis not present

## 2020-11-04 DIAGNOSIS — E119 Type 2 diabetes mellitus without complications: Secondary | ICD-10-CM | POA: Diagnosis not present

## 2021-01-22 DIAGNOSIS — E7800 Pure hypercholesterolemia, unspecified: Secondary | ICD-10-CM | POA: Diagnosis not present

## 2021-01-22 DIAGNOSIS — E1169 Type 2 diabetes mellitus with other specified complication: Secondary | ICD-10-CM | POA: Diagnosis not present

## 2021-01-22 DIAGNOSIS — I10 Essential (primary) hypertension: Secondary | ICD-10-CM | POA: Diagnosis not present

## 2021-01-22 DIAGNOSIS — E782 Mixed hyperlipidemia: Secondary | ICD-10-CM | POA: Diagnosis not present

## 2021-01-22 DIAGNOSIS — N183 Chronic kidney disease, stage 3 unspecified: Secondary | ICD-10-CM | POA: Diagnosis not present

## 2021-01-22 DIAGNOSIS — E1122 Type 2 diabetes mellitus with diabetic chronic kidney disease: Secondary | ICD-10-CM | POA: Diagnosis not present

## 2021-02-18 ENCOUNTER — Other Ambulatory Visit: Payer: Self-pay | Admitting: Cardiology

## 2021-05-20 DIAGNOSIS — E1122 Type 2 diabetes mellitus with diabetic chronic kidney disease: Secondary | ICD-10-CM | POA: Diagnosis not present

## 2021-05-20 DIAGNOSIS — E7800 Pure hypercholesterolemia, unspecified: Secondary | ICD-10-CM | POA: Diagnosis not present

## 2021-05-20 DIAGNOSIS — I10 Essential (primary) hypertension: Secondary | ICD-10-CM | POA: Diagnosis not present

## 2021-05-20 DIAGNOSIS — E782 Mixed hyperlipidemia: Secondary | ICD-10-CM | POA: Diagnosis not present

## 2021-05-20 DIAGNOSIS — N183 Chronic kidney disease, stage 3 unspecified: Secondary | ICD-10-CM | POA: Diagnosis not present

## 2021-05-20 DIAGNOSIS — E1169 Type 2 diabetes mellitus with other specified complication: Secondary | ICD-10-CM | POA: Diagnosis not present

## 2021-07-16 DIAGNOSIS — I10 Essential (primary) hypertension: Secondary | ICD-10-CM | POA: Diagnosis not present

## 2021-07-16 DIAGNOSIS — Z125 Encounter for screening for malignant neoplasm of prostate: Secondary | ICD-10-CM | POA: Diagnosis not present

## 2021-07-16 DIAGNOSIS — Z0000 Encounter for general adult medical examination without abnormal findings: Secondary | ICD-10-CM | POA: Diagnosis not present

## 2021-07-16 DIAGNOSIS — N183 Chronic kidney disease, stage 3 unspecified: Secondary | ICD-10-CM | POA: Diagnosis not present

## 2021-07-16 DIAGNOSIS — Z888 Allergy status to other drugs, medicaments and biological substances status: Secondary | ICD-10-CM | POA: Diagnosis not present

## 2021-07-16 DIAGNOSIS — N529 Male erectile dysfunction, unspecified: Secondary | ICD-10-CM | POA: Diagnosis not present

## 2021-07-16 DIAGNOSIS — E7800 Pure hypercholesterolemia, unspecified: Secondary | ICD-10-CM | POA: Diagnosis not present

## 2021-07-16 DIAGNOSIS — R32 Unspecified urinary incontinence: Secondary | ICD-10-CM | POA: Diagnosis not present

## 2021-07-16 DIAGNOSIS — I420 Dilated cardiomyopathy: Secondary | ICD-10-CM | POA: Diagnosis not present

## 2021-07-16 DIAGNOSIS — I700 Atherosclerosis of aorta: Secondary | ICD-10-CM | POA: Diagnosis not present

## 2021-07-16 DIAGNOSIS — E1169 Type 2 diabetes mellitus with other specified complication: Secondary | ICD-10-CM | POA: Diagnosis not present

## 2021-07-22 NOTE — Progress Notes (Signed)
?  ?Cardiology Office Note ? ? ?Date:  07/28/2021  ? ?ID:  Jeffery Stone, DOB [birth year], MRN [id number] ? ?PCP:  Jeffery Ruff, MD (Inactive)  ?Cardiologist:   Jeffery Laughter [place], MD  ? ?Chief Complaint  ?Patient presents with  ? Coronary Artery Disease  ? ? ?  ?History of Present Illness: ?Jeffery Stone is a 66 y.o. male who is seen for follow up of cardiomyopathy and chest pain. He has a history of DM type 2, HLD, HTN and LBBB. He has a history of OSA. Aortic atherosclerosis noted on CT.  Apparently has cardiomyopathy with EF 40-45%. Myoview perfusion study in 2006 showed no ischemia.  ? ?Recently he was seen for a routine physical. Noted some symptoms of chest pain around his ribs R>L. Noted more when doing yard work. On physical labs showed an Elevated CPK to 484. He was sent to the ED where troponin levels were normal x 2. Ecg showed his chronic LBBB. He states his CPK went down to 216 but then back up again. Simvastatin held. Has been seen by Dr Bronson Curb with no clear etiology found.  ? ?To further evaluate he had Myoview study in April showing a fixed septal perfusion abnormality felt to be c/w LBBB artifact. EF 42%. Subsequent Echo showed EF 35-40%. Global HK. Recommended starting on Entresto.  ? ?On follow up today he is doing ok.  Still denies any chest pain, dyspnea. He notes some dizziness about 2 hours after taking his morning meds. BP 938-101 systolic.  No edema. He is active.  ? ? ? ?Past Medical History:  ?Diagnosis Date  ? Diabetes mellitus   ? type 2  ? Hypercholesterolemia   ? Hypertension   ? LBBB (left bundle branch block)   ? Nonischemic dilated cardiomyopathy ([place])   ? Est. EF of 40-45%  ? Sleep apnea   ? ? ?Past Surgical History:  ?Procedure Laterality Date  ? BACK SURGERY    ? ? ? ?Current Outpatient Medications  ?Medication Sig Dispense Refill  ? aspirin EC 81 MG tablet Take 81 mg by mouth daily.    ? Haematologist lancets USE TO TEST BLOOD SUGAR ONCE DAILY E11.9    ? Cholecalciferol  (VITAMIN D3) 25 MCG (1000 UT) CAPS Take 1,000 Units by mouth daily.    ? ezetimibe (ZETIA) 10 MG tablet TAKE 1 TABLET BY MOUTH EVERY DAY 90 tablet 3  ? fenofibrate (TRICOR) 145 MG tablet Take 145 mg by mouth daily.      ? Ferrous Sulfate (IRON) 325 (65 Fe) MG TABS 1 tablet    ? multivitamin (THERAGRAN) per tablet Take 1 tablet by mouth daily.      ? ONETOUCH ULTRA test strip daily.    ? sacubitril-valsartan (ENTRESTO) 49-51 MG Take 1 tablet by mouth 2 (two) times daily. 180 tablet 3  ? Triprolidine-Pseudoephedrine (ANTIHISTAMINE PO) Take by mouth daily.    ? sildenafil (VIAGRA) 100 MG tablet TAKE 1/2 TABLET ONCE DAILY IF NEEDED (Patient not taking: Reported on 07/28/2021)    ? ?No current facility-administered medications for this visit.  ? ? ?Allergies:   Poison ivy extract and Simvastatin  ? ? ?Social History:  The patient  reports that he has never smoked. He has never used smokeless tobacco. He reports that he does not drink alcohol and does not use drugs.  ? ?Family History:  The patient's family history includes Cancer in his father; Dementia in his mother; Diabetes in his father; Healthy in his son  and son; Heart attack in his brother; Hypertension in his father and mother; Liver cancer in his mother; Stroke in his mother; Transient ischemic attack in his mother.  ? ? ?ROS:  Please see the history of present illness.   Otherwise, review of systems are positive for none.   All other systems are reviewed and negative.  ? ? ?PHYSICAL EXAM: ?VS:  BP 128/76   Pulse 74   Ht '6\' 2"'$  (1.88 m)   Wt 187 lb 9.6 oz (85.1 kg)   SpO2 96%   BMI 24.09 kg/m?  , BMI Body mass index is 24.09 kg/m?. ?GEN: Well nourished, well developed, in no acute distress  ?HEENT: normal  ?Neck: no JVD, carotid bruits, or masses ?Cardiac: RRR; no murmurs, rubs, or gallops,no edema  ?Respiratory:  clear to auscultation bilaterally, normal work of breathing ?GI: soft, nontender, nondistended, + BS ?MS: no deformity or atrophy  ?Skin: warm and  dry, multiple chigger bites on LE ?Neuro:  Strength and sensation are intact ?Psych: euthymic mood, full affect ? ? ?EKG:  EKG is ordered today. NSR rate 74. LBBB. I have personally reviewed and interpreted this study. ? ? ? ?Recent Labs: ?No results found for requested labs within last 8760 hours.  ? ? ?Lipid Panel ?   ?Component Value Date/Time  ? CHOL 179 09/29/2019 0823  ? TRIG 109 09/29/2019 0823  ? HDL 30 (L) 09/29/2019 0981  ? CHOLHDL 6.0 (H) 09/29/2019 1914  ? LDLCALC 129 (H) 09/29/2019 7829  ? ?  ?Dated 07/06/19: cholesterol 136, triglycerides 81, HDL 32, LDL 71. A1c 6%. Creatinine 1.2.. Hgb 12. Otherwise CMET and CBC normal.  ?Dated 07/16/21: cholesterol 161, triglycerides 125, HDL 32, LDL 106. A1c 6%. CMET normal ? ? ?Wt Readings from Last 3 Encounters:  ?07/28/21 187 lb 9.6 oz (85.1 kg)  ?08/12/20 192 lb 3.2 oz (87.2 kg)  ?06/27/20 189 lb 9.6 oz (86 kg)  ?  ? ? ?Other studies Reviewed: ?Additional studies/ records that were reviewed today include:  ? ?Myoview 08/16/19: Study Highlights ? ?  ?The left ventricular ejection fraction is moderately decreased (30-44%). ?Nuclear stress EF: 42%. ?There was no ST segment deviation noted during stress. ?Defect 1: There is a large defect of mild severity present in the mid anteroseptal, mid inferoseptal and apical septal location. ?This is an intermediate risk study. ?  ?Intermediate risk stress nuclear study due to moderately reduced left ventricular systolic function. ?There is a fixed septal perfusion defect consistent with LBBB-related artifact. No reversible ischemia is seen.  ? ? ?Echo 08/21/19: IMPRESSIONS  ? ? ? 1. Left ventricular ejection fraction, by estimation, is 35 to 40%. The  ?left ventricle has moderately decreased function. The left ventricle  ?demonstrates global hypokinesis. The left ventricular internal cavity size  ?was mildly dilated. Left ventricular  ?diastolic parameters are indeterminate.  ? 2. Right ventricular systolic function is normal.  The right ventricular  ?size is normal.  ? 3. The mitral valve is normal in structure. Mild mitral valve  ?regurgitation.  ? 4. The aortic valve is normal in structure. Aortic valve regurgitation is  ?not visualized.  ? ? ?ASSESSMENT AND PLAN: ? ?1. History of nonischemic CM with EF 40-45%. Currently class 1. I suspect this is at least partly related to dyssynchrony with his LBBB. Also may be related to HTN and DM. Now EF 42% by Myoview and 35-40% by Echo. On Entresto. Recommend reducing Coreg to 3.125 mg  bid due to lightheadedness.  ?3.  Elevated CPK. Not cardiac. Now off statin. Seen by Rheumatology and evaluation negative.  ?4. LBBB ?5. DM type 2 ?6. HTN- controlled.   ?7. HLD off statin due to elevated CPK. On fenofibrate and Zetia. LDL 108 ?8. Aortic atherosclerosis. Noted on CT.  ? ? ?Current medicines are reviewed at length with the patient today.  The patient has no concerns regarding medicines. ? ?The following changes have been made:  no change ? ?Labs/ tests ordered today include:  ? ?No orders of the defined types were placed in this encounter. ? ? ? ?Disposition:   FU with me one year. ? ?Signed, ?Kristen Bushway Martinique, MD  ?07/28/2021 3:28 PM    ?[facility] ?[address] [place], [place], [address] ?Phone [phone], Fax [phone] ?

## 2021-07-28 ENCOUNTER — Ambulatory Visit: Payer: PPO | Admitting: Cardiology

## 2021-07-28 ENCOUNTER — Encounter: Payer: Self-pay | Admitting: Cardiology

## 2021-07-28 VITALS — BP 128/76 | HR 74 | Ht 74.0 in | Wt 187.6 lb

## 2021-07-28 DIAGNOSIS — I420 Dilated cardiomyopathy: Secondary | ICD-10-CM

## 2021-07-28 DIAGNOSIS — I10 Essential (primary) hypertension: Secondary | ICD-10-CM

## 2021-07-28 DIAGNOSIS — I447 Left bundle-branch block, unspecified: Secondary | ICD-10-CM

## 2021-07-28 DIAGNOSIS — E7800 Pure hypercholesterolemia, unspecified: Secondary | ICD-10-CM

## 2021-07-28 MED ORDER — CARVEDILOL 3.125 MG PO TABS: 3.1250 mg | ORAL_TABLET | Freq: Two times a day (BID) | ORAL | 3 refills | Status: DC

## 2021-07-28 NOTE — Patient Instructions (Addendum)
Medication Instructions:  ?Decrease Coreg to 3.125 mg twice a day ?Continue all other medications ?*If you need a refill on your cardiac medications before your next appointment, please call your pharmacy* ? ? ?Lab Work: ?None ordered ? ? ?Testing/Procedures: ?None  ordered ? ? ?Follow-Up: ?At [facility], you and your health needs are our priority.  As part of our continuing mission to provide you with exceptional heart care, we have created designated Provider Care Teams.  These Care Teams include your primary Cardiologist (physician) and Advanced Practice Providers (APPs -  Physician Assistants and Nurse Practitioners) who all work together to provide you with the care you need, when you need it. ? ?We recommend signing up for the patient portal called "MyChart".  Sign up information is provided on this After Visit Summary.  MyChart is used to connect with patients for Virtual Visits (Telemedicine).  Patients are able to view lab/test results, encounter notes, upcoming appointments, etc.  Non-urgent messages can be sent to your provider as well.   ?To learn more about what you can do with MyChart, go to [email or web address].   ? ?Your next appointment: 1 year    Call in Jan to schedule April appointment    ?  ? ?The format for your next appointment: Office ? ? ?Provider: Dr.Jordan  ?}  ? ? ?Dr.Kate Tabori  Primary Care ? ?

## 2021-07-30 NOTE — Progress Notes (Signed)
? ?Office Visit Note ? ?Patient: Jeffery Stone             ?Date of Birth: [birth year]           ?MRN: [id number]             ?PCP: Pcp, No ?Referring: Leighton Ruff, MD ?Visit Date: 08/12/2021 ?Occupation: '@GUAROCC'$ @ ? ?Subjective:  ?Positive ANA ? ?History of Present Illness: Jeffery Stone is a 67 y.o. male returns today after his last visit in April 2022.  He states that he has been doing well.  He has not experienced any muscle spasms or muscle weakness.  He does a lot of yard work and has occasional stiffness in his muscles after yard work.  He denies any history of oral ulcers, nasal ulcers, malar rash, photosensitivity, Raynaud's phenomenon, sicca symptoms, inflammatory arthritis or lymphadenopathy. ? ?Activities of Daily Living:  ?Patient reports morning stiffness for 10-15 minutes.   ?Patient Reports nocturnal pain.  ?Difficulty dressing/grooming: Denies ?Difficulty climbing stairs: Denies ?Difficulty getting out of chair: Denies ?Difficulty using hands for taps, buttons, cutlery, and/or writing: Denies ? ?Review of Systems  ?Constitutional:  Negative for fatigue.  ?HENT:  Negative for mouth sores, mouth dryness and nose dryness.   ?Eyes:  Negative for pain, itching and dryness.  ?Respiratory:  Negative for shortness of breath and difficulty breathing.   ?Cardiovascular:  Negative for chest pain and palpitations.  ?Gastrointestinal:  Negative for blood in stool, constipation and diarrhea.  ?Endocrine: Positive for increased urination.  ?Genitourinary:  Negative for difficulty urinating.  ?Musculoskeletal:  Positive for joint pain, joint pain, myalgias, morning stiffness and myalgias. Negative for joint swelling and muscle tenderness.  ?Skin:  Negative for color change, rash, redness and sensitivity to sunlight.  ?Allergic/Immunologic: Negative for susceptible to infections.  ?Neurological:  Positive for dizziness. Negative for numbness, headaches, memory loss and weakness.  ?Hematological:  Negative for  bruising/bleeding tendency and swollen glands.  ?Psychiatric/Behavioral:  Negative for confusion.   ? ?PMFS History:  ?Patient Active Problem List  ? Diagnosis Date Noted  ? Nonischemic dilated cardiomyopathy ([place])   ? Left bundle branch block 01/14/2011  ? Hypertension 01/14/2011  ? Hypercholesterolemia 01/14/2011  ? Diabetes mellitus ([place]) 01/14/2011  ?  ?Past Medical History:  ?Diagnosis Date  ? Diabetes mellitus   ? type 2  ? Hypercholesterolemia   ? Hypertension   ? LBBB (left bundle branch block)   ? Nonischemic dilated cardiomyopathy ([place])   ? Est. EF of 40-45%  ? Sleep apnea   ?  ?Family History  ?Problem Relation Age of Onset  ? Diabetes Father   ? Hypertension Father   ?     ?  ? Cancer Father   ?     kidney  ? Hypertension Mother   ? Stroke Mother   ? Transient ischemic attack Mother   ?     multiple  ? Dementia Mother   ? Liver cancer Mother   ? Heart attack Brother   ? Healthy Son   ? Healthy Son   ? ?Past Surgical History:  ?Procedure Laterality Date  ? BACK SURGERY    ? ?Social History  ? ?Social History Narrative  ? Not on file  ? ?Immunization History  ?Administered Date(s) Administered  ? Influenza,inj,Quad PF,6+ Mos 02/08/2013  ? PFIZER(Purple Top)SARS-COV-2 Vaccination 07/10/2019, 07/31/2019, 02/11/2020  ?  ? ?Objective: ?Vital Signs: BP 95/63 (BP Location: Left Arm, Patient Position: Sitting, Cuff Size: Normal)   Pulse 65   Ht  $'6\' 2"'d$  (1.88 m)   Wt 184 lb (83.5 kg)   BMI 23.62 kg/m?   ? ?Physical Exam ?Vitals and nursing note reviewed.  ?Constitutional:   ?   Appearance: He is well-developed.  ?HENT:  ?   Head: Normocephalic and atraumatic.  ?Eyes:  ?   Conjunctiva/sclera: Conjunctivae normal.  ?   Pupils: Pupils are equal, round, and reactive to light.  ?Cardiovascular:  ?   Rate and Rhythm: Normal rate and regular rhythm.  ?   Heart sounds: Normal heart sounds.  ?Pulmonary:  ?   Effort: Pulmonary effort is normal.  ?   Breath sounds: Normal breath sounds.  ?Abdominal:  ?   General: Bowel  sounds are normal.  ?   Palpations: Abdomen is soft.  ?Musculoskeletal:  ?   Cervical back: Normal range of motion and neck supple.  ?Skin: ?   General: Skin is warm and dry.  ?   Capillary Refill: Capillary refill takes less than 2 seconds.  ?Neurological:  ?   Mental Status: He is alert and oriented to person, place, and time.  ?Psychiatric:     ?   Behavior: Behavior normal.  ?  ? ?Musculoskeletal Exam: C-spine, thoracic and lumbar spine were in good range of motion.  Shoulder joints, elbow joints, wrist joints, MCPs PIPs and DIPs with good range of motion with no synovitis.  He had good range of motion of bilateral hip joints, knee joints, ankles and MTPs.  No synovitis was noted.  No muscular weakness or tenderness was noted.  He had 5/5 strength in all 4 extremities.  He has no difficulty getting up from the squatting position. ? ?CDAI Exam: ?CDAI Score: -- ?Patient Global: --; Provider Global: -- ?Swollen: --; Tender: -- ?Joint Exam 08/12/2021  ? ?No joint exam has been documented for this visit  ? ?There is currently no information documented on the homunculus. Go to the Rheumatology activity and complete the homunculus joint exam. ? ?Investigation: ?No additional findings. ? ?Imaging: ?No results found. ? ?Recent Labs: ?Lab Results  ?Component Value Date  ? WBC 6.5 07/06/2019  ? HGB 12.0 (L) 07/06/2019  ? PLT 250 07/06/2019  ? NA 139 09/29/2019  ? K 4.6 09/29/2019  ? CL 103 09/29/2019  ? CO2 20 09/29/2019  ? GLUCOSE 90 09/29/2019  ? BUN 31 (H) 09/29/2019  ? CREATININE 1.21 09/29/2019  ? BILITOT 0.3 09/29/2019  ? ALKPHOS 38 (L) 09/29/2019  ? AST 25 09/29/2019  ? ALT 18 09/29/2019  ? PROT 7.2 09/29/2019  ? ALBUMIN 4.7 09/29/2019  ? CALCIUM 9.2 09/29/2019  ? GFRAA 72 09/29/2019  ? ? ?July 16, 2021 lipid panel LDL 106, HDL 32, hemoglobin A1c 6.0, CMP normal GFR 71, AST 24, ALT 18, PSA normal ? ? ?Speciality Comments: No specialty comments available. ? ?Procedures:  ?No procedures performed ?Allergies: Poison  ivy extract and Simvastatin  ? ?Assessment / Plan:     ?Visit Diagnoses: Elevated CPK - Repeat CK normal, aldolase normal, Jo 1 -, ANA 1: 640 speckled, ENA negative, complements normal.  He had no muscular weakness or tenderness on examination.  He had positive ANA.  He denies any history of oral ulcers, nasal ulcers, malar rash, photosensitivity, sicca symptoms, Raynaud's phenomenon or inflammatory arthritis.  I advised him to contact me in case he develops any new symptoms.  Otherwise he will return for follow-up on as needed basis.  He brought some labs with him today which were reviewed.  CMP was  normal. ? ?Myalgia-he denies any muscular weakness or tenderness.  He had good strength in all 4 extremities. ? ?Vitamin D deficiency-he states that he has been taking vitamin D 1000 units daily. ? ?Primary osteoarthritis of both hands-he has some PIP and DIP thickening but no synovitis was noted.  Joint protection was discussed. ? ?DDD (degenerative disc disease), lumbar - Discectomy 1995.  He has occasional discomfort in his back.  He denies any radiculopathy. ? ?Other medical problems are listed as follows: ? ?Essential hypertension ? ?Nonischemic dilated cardiomyopathy ([place]) ? ?LBBB (left bundle branch block) ? ?Hypercholesterolemia - Treated with simvastatin for 15 years.  He is on Zetia now. ? ?Hepatic steatosis ? ?Chronic kidney disease due to type 2 diabetes mellitus ([place]) ? ?OSA (obstructive sleep apnea) ? ?Tick bite, sequela ? ?Orders: ?No orders of the defined types were placed in this encounter. ? ?No orders of the defined types were placed in this encounter. ? ? ?Follow-Up Instructions: Return if symptoms worsen or fail to improve, for +ANA. ? ? ?Bo Merino, MD ? ?Note - This record has been created using [employer].  ?Chart creation errors have been sought, but may not always  ?have been located. Such creation errors do not reflect on  ?the standard of medical care.  ?

## 2021-07-31 ENCOUNTER — Other Ambulatory Visit: Payer: Self-pay | Admitting: Cardiology

## 2021-08-12 ENCOUNTER — Ambulatory Visit: Payer: PPO | Admitting: Rheumatology

## 2021-08-12 ENCOUNTER — Encounter: Payer: Self-pay | Admitting: Rheumatology

## 2021-08-12 VITALS — BP 95/63 | HR 65 | Ht 74.0 in | Wt 184.0 lb

## 2021-08-12 DIAGNOSIS — E559 Vitamin D deficiency, unspecified: Secondary | ICD-10-CM

## 2021-08-12 DIAGNOSIS — G4733 Obstructive sleep apnea (adult) (pediatric): Secondary | ICD-10-CM

## 2021-08-12 DIAGNOSIS — I10 Essential (primary) hypertension: Secondary | ICD-10-CM

## 2021-08-12 DIAGNOSIS — I447 Left bundle-branch block, unspecified: Secondary | ICD-10-CM

## 2021-08-12 DIAGNOSIS — I420 Dilated cardiomyopathy: Secondary | ICD-10-CM | POA: Diagnosis not present

## 2021-08-12 DIAGNOSIS — M5136 Other intervertebral disc degeneration, lumbar region: Secondary | ICD-10-CM | POA: Diagnosis not present

## 2021-08-12 DIAGNOSIS — M791 Myalgia, unspecified site: Secondary | ICD-10-CM

## 2021-08-12 DIAGNOSIS — K760 Fatty (change of) liver, not elsewhere classified: Secondary | ICD-10-CM

## 2021-08-12 DIAGNOSIS — E1122 Type 2 diabetes mellitus with diabetic chronic kidney disease: Secondary | ICD-10-CM

## 2021-08-12 DIAGNOSIS — E7800 Pure hypercholesterolemia, unspecified: Secondary | ICD-10-CM

## 2021-08-12 DIAGNOSIS — W57XXXS Bitten or stung by nonvenomous insect and other nonvenomous arthropods, sequela: Secondary | ICD-10-CM

## 2021-08-12 DIAGNOSIS — R748 Abnormal levels of other serum enzymes: Secondary | ICD-10-CM

## 2021-08-12 DIAGNOSIS — M19041 Primary osteoarthritis, right hand: Secondary | ICD-10-CM

## 2021-08-12 DIAGNOSIS — M19042 Primary osteoarthritis, left hand: Secondary | ICD-10-CM

## 2021-08-15 ENCOUNTER — Encounter (HOSPITAL_BASED_OUTPATIENT_CLINIC_OR_DEPARTMENT_OTHER): Payer: Self-pay | Admitting: Pediatrics

## 2021-08-15 ENCOUNTER — Emergency Department (HOSPITAL_BASED_OUTPATIENT_CLINIC_OR_DEPARTMENT_OTHER)
Admission: EM | Admit: 2021-08-15 | Discharge: 2021-08-15 | Disposition: A | Discharge status: Home / Self Care | Payer: PPO | Attending: Emergency Medicine | Admitting: Emergency Medicine

## 2021-08-15 ENCOUNTER — Other Ambulatory Visit: Payer: Self-pay

## 2021-08-15 ENCOUNTER — Emergency Department (HOSPITAL_BASED_OUTPATIENT_CLINIC_OR_DEPARTMENT_OTHER): Payer: PPO | Admitting: Radiology

## 2021-08-15 DIAGNOSIS — R079 Chest pain, unspecified: Secondary | ICD-10-CM

## 2021-08-15 DIAGNOSIS — Z7982 Long term (current) use of aspirin: Secondary | ICD-10-CM | POA: Diagnosis not present

## 2021-08-15 DIAGNOSIS — I428 Other cardiomyopathies: Secondary | ICD-10-CM | POA: Diagnosis not present

## 2021-08-15 DIAGNOSIS — I447 Left bundle-branch block, unspecified: Secondary | ICD-10-CM | POA: Insufficient documentation

## 2021-08-15 DIAGNOSIS — R0789 Other chest pain: Secondary | ICD-10-CM | POA: Diagnosis not present

## 2021-08-15 LAB — CBC
HCT: 36.1 % — ABNORMAL LOW (ref 39.0–52.0)
Hemoglobin: 12.1 g/dL — ABNORMAL LOW (ref 13.0–17.0)
MCH: 30.5 pg (ref 26.0–34.0)
MCHC: 33.5 g/dL (ref 30.0–36.0)
MCV: 90.9 fL (ref 80.0–100.0)
Platelets: 266 10*3/uL (ref 150–400)
RBC: 3.97 MIL/uL — ABNORMAL LOW (ref 4.22–5.81)
RDW: 13.8 % (ref 11.5–15.5)
WBC: 4.7 10*3/uL (ref 4.0–10.5)
nRBC: 0 % (ref 0.0–0.2)

## 2021-08-15 LAB — TROPONIN I (HIGH SENSITIVITY)
Troponin I (High Sensitivity): 2 ng/L (ref <18)
Troponin I (High Sensitivity): 2 ng/L (ref <18)

## 2021-08-15 LAB — BASIC METABOLIC PANEL
Anion gap: 8 (ref 5–15)
BUN: 30 mg/dL — ABNORMAL HIGH (ref 8–23)
CO2: 25 mmol/L (ref 22–32)
Calcium: 10.5 mg/dL — ABNORMAL HIGH (ref 8.9–10.3)
Chloride: 106 mmol/L (ref 98–111)
Creatinine, Ser: 1.32 mg/dL — ABNORMAL HIGH (ref 0.61–1.24)
GFR, Estimated: 59 mL/min — ABNORMAL LOW (ref >60)
Glucose, Bld: 98 mg/dL (ref 70–99)
Potassium: 4.2 mmol/L (ref 3.5–5.1)
Sodium: 139 mmol/L (ref 135–145)

## 2021-08-15 NOTE — Discharge Instructions (Signed)
Please follow-up with your cardiology office.  Call their appointment to request close follow-up appointment.  If you are having worsening chest pains, difficulty breathing, any episodes of passing out or other new concerning symptom, come back to ER for reassessment. ?

## 2021-08-15 NOTE — ED Triage Notes (Signed)
C/O chest pain x several weeks; worst last night 1 hour after eating. Patient reported hx of LBBB. States feeling dizziness as well , ?

## 2021-08-15 NOTE — ED Provider Notes (Signed)
?[name] ?Provider Note ? ? ?CSN: [id number] ?Arrival date & time: 08/15/21  0901 ? ?  ? ?History ? ?Chief Complaint  ?Patient presents with  ? Chest Pain  ? ? ?Jeffery Stone is a 66 y.o. male.  Presented to the emergency department due to concern for chest pain.  Patient reports that he has been having some intermittent episodes of chest pain over the past few weeks.  Seem to be getting worse more recently.  Yesterday had about 8 episodes in the evening of chest discomfort.  Left-sided, brief, each time lasting only a second or 2.  Occurring at rest, not associated with exertion.  States that he has been working hard in the yard recently and did not experience any chest pain while he was exerting himself.  No increase shortness of breath.  Currently does not have any chest pain.  States that this morning he felt somewhat fatigued, states that he has had issues with intermittent episodes of lightheadedness/dizziness.  No syncope or presyncope. ? ?Completed extensive chart review, reviewed last cardiology note, last stress test, last echocardiogram note.  Patient has history of left bundle branch block, nonischemic cardiomyopathy.  EF 35 to 40% on last echo. ? ?HPI ? ?  ? ?Home Medications ?Prior to Admission medications   ?Medication Sig Start Date End Date Taking? Authorizing Provider  ?aspirin EC 81 MG tablet Take 81 mg by mouth daily.    [provider]  ?Bayer Microlet Lancets lancets USE TO TEST BLOOD SUGAR ONCE DAILY E11.9 05/23/18   [provider]  ?carvedilol (COREG) 3.125 MG tablet Take 1 tablet (3.125 mg total) by mouth 2 (two) times daily. 07/28/21 10/26/21  Martinique, [name], MD  ?Cholecalciferol (VITAMIN D3) 25 MCG (1000 UT) CAPS Take 1,000 Units by mouth daily.    [provider]  ?ENTRESTO 49-51 MG TAKE 1 TABLET BY MOUTH TWICE A DAY 07/31/21   Martinique, [name], MD  ?ezetimibe (ZETIA) 10 MG tablet TAKE 1 TABLET BY MOUTH EVERY DAY 02/18/21   Martinique, [name],  MD  ?fenofibrate (TRICOR) 145 MG tablet Take 145 mg by mouth daily.      [provider]  ?Ferrous Sulfate (IRON) 325 (65 Fe) MG TABS 1 tablet    [provider]  ?multivitamin (THERAGRAN) per tablet Take 1 tablet by mouth daily.      [provider]  ?Donald Siva test strip daily. 11/06/19   [provider]  ?sildenafil (VIAGRA) 100 MG tablet  05/28/18   [provider]  ?Triprolidine-Pseudoephedrine (ANTIHISTAMINE PO) Take by mouth daily.    [provider]  ?   ? ?Allergies    ?Poison ivy extract and Simvastatin   ? ?Review of Systems   ?Review of Systems  ?Constitutional:  Negative for chills and fever.  ?HENT:  Negative for ear pain and sore throat.   ?Eyes:  Negative for pain and visual disturbance.  ?Respiratory:  Negative for cough and shortness of breath.   ?Cardiovascular:  Positive for chest pain. Negative for palpitations.  ?Gastrointestinal:  Negative for abdominal pain and vomiting.  ?Genitourinary:  Negative for dysuria and hematuria.  ?Musculoskeletal:  Negative for arthralgias and back pain.  ?Skin:  Negative for color change and rash.  ?Neurological:  Negative for seizures and syncope.  ?All other systems reviewed and are negative. ? ?Physical Exam ?Updated Vital Signs ?BP 123/70   Pulse (!) 56   Temp 98.1 ?F (36.7 ?C)   Resp 16  Ht '6\' 2"'$  (1.88 m)   Wt 83.5 kg   SpO2 100%   BMI 23.62 kg/m?  ?Physical Exam ?Vitals and nursing note reviewed.  ?Constitutional:   ?   General: He is not in acute distress. ?   Appearance: He is well-developed.  ?HENT:  ?   Head: Normocephalic and atraumatic.  ?Eyes:  ?   Conjunctiva/sclera: Conjunctivae normal.  ?Cardiovascular:  ?   Rate and Rhythm: Normal rate and regular rhythm.  ?   Heart sounds: No murmur heard. ?Pulmonary:  ?   Effort: Pulmonary effort is normal. No respiratory distress.  ?   Breath sounds: Normal breath sounds.  ?Abdominal:  ?   Palpations: Abdomen is soft.  ?   Tenderness: There is no  abdominal tenderness.  ?Musculoskeletal:     ?   General: No swelling.  ?   Cervical back: Neck supple.  ?Skin: ?   General: Skin is warm and dry.  ?   Capillary Refill: Capillary refill takes less than 2 seconds.  ?Neurological:  ?   Mental Status: He is alert.  ?Psychiatric:     ?   Mood and Affect: Mood normal.  ? ? ?ED Results / Procedures / Treatments   ?Labs ?(all labs ordered are listed, but only abnormal results are displayed) ?Labs Reviewed  ?BASIC METABOLIC PANEL - Abnormal; Notable for the following components:  ?    Result Value  ? BUN 30 (*)   ? Creatinine, Ser 1.32 (*)   ? Calcium 10.5 (*)   ? GFR, Estimated 59 (*)   ? All other components within normal limits  ?CBC - Abnormal; Notable for the following components:  ? RBC 3.97 (*)   ? Hemoglobin 12.1 (*)   ? HCT 36.1 (*)   ? All other components within normal limits  ?TROPONIN I (HIGH SENSITIVITY)  ?TROPONIN I (HIGH SENSITIVITY)  ? ? ?EKG ?EKG Interpretation ? ?Date/Time:  Friday August 15 2021 09:10:01 EDT ?Ventricular Rate:  78 ?PR Interval:  138 ?QRS Duration: 148 ?QT Interval:  430 ?QTC Calculation: 490 ?R Axis:   32 ?Text Interpretation: Normal sinus rhythm Left bundle branch block Abnormal ECG When compared with ECG of 06-Jul-2019 18:18, No significant change was found Confirmed by [name] [phone]) on 08/15/2021 9:13:02 AM ? ?Radiology ?No results found. ? ?Procedures ?Procedures  ? ? ?Medications Ordered in ED ?Medications - No data to display ? ?ED Course/ Medical Decision Making/ A&P ?  ?                        ?Medical Decision Making ?Amount and/or Complexity of Data Reviewed ?Labs: ordered. ?Radiology: ordered. ? ? ?66 year old male presenting for chest pain. ? ?Completed extensive chart review, reviewed last cardiology note, last stress test, last echocardiogram note.  Patient has history of left bundle branch block, nonischemic cardiomyopathy.  EF 35 to 40% on last echo. ? ?EKG today unchanged.  Initial troponin normal.  CXR was  obtained, independently reviewed, no pneumothorax, no pneumonia.  Per radiologist no acute findings.  The remainder of his basic lab work was normal.  No significant anemia or electrolyte derangement.  I discussed the case with Dr. Marlou Porch on-call for cardiology.  He reviewed case, EKG.  He states that if repeat troponin negative then he would advise follow-up in the outpatient setting.  Reassessed patient he remains well-appearing with stable vital signs.  His repeat troponin is normal, feel he is stable for discharge  at this time.  I strongly advise close follow-up with cardiology.  Reviewed return precautions should he have worsening pain or other new concerning symptoms.  Discharged home with wife who also agrees with current plan. ? ? ? ?After the discussed management above, the patient was determined to be safe for discharge.  The patient was in agreement with this plan and all questions regarding their care were answered.  ED return precautions were discussed and the patient will return to the ED with any significant worsening of condition. ? ? ? ? ? ? ? ? ?Final Clinical Impression(s) / ED Diagnoses ?Final diagnoses:  ?Chest pain, unspecified type  ?LBBB (left bundle branch block)  ?Nonischemic cardiomyopathy ([place])  ? ? ?Rx / DC Orders ?ED Discharge Orders   ? ? None  ? ?  ? ? ?  ?Lucrezia Starch, MD ?08/17/21 1418 ? ?

## 2021-08-19 ENCOUNTER — Ambulatory Visit: Payer: PPO | Admitting: Physician Assistant

## 2021-08-19 ENCOUNTER — Encounter: Payer: Self-pay | Admitting: Physician Assistant

## 2021-08-19 VITALS — BP 114/56 | HR 70 | Ht 74.0 in | Wt 184.0 lb

## 2021-08-19 DIAGNOSIS — R079 Chest pain, unspecified: Secondary | ICD-10-CM | POA: Diagnosis not present

## 2021-08-19 DIAGNOSIS — I420 Dilated cardiomyopathy: Secondary | ICD-10-CM | POA: Diagnosis not present

## 2021-08-19 DIAGNOSIS — I10 Essential (primary) hypertension: Secondary | ICD-10-CM

## 2021-08-19 DIAGNOSIS — I447 Left bundle-branch block, unspecified: Secondary | ICD-10-CM

## 2021-08-19 MED ORDER — NITROGLYCERIN 0.4 MG SL SUBL: 0.4000 mg | SUBLINGUAL_TABLET | SUBLINGUAL | 1 refills | Status: AC | PRN

## 2021-08-19 MED ORDER — PANTOPRAZOLE SODIUM 40 MG PO TBEC: 40.0000 mg | DELAYED_RELEASE_TABLET | ORAL | 2 refills | Status: DC | PRN

## 2021-08-19 NOTE — Patient Instructions (Signed)
Medication Instructions:  ?Stat Protonix 40 mg ( Take 1 Tablet Daily As Needed for Indigestion). ?*If you need a refill on your cardiac medications before your next appointment, please call your pharmacy* ? ? ?Lab Work: ?None ?If you have labs (blood work) drawn today and your tests are completely normal, you will receive your results only by: ?MyChart Message (if you have MyChart) OR ?A paper copy in the mail ?If you have any lab test that is abnormal or we need to change your treatment, we will call you to review the results. ? ? ?Testing/Procedures: ?None ? ? ?Follow-Up: ?At [facility], you and your health needs are our priority.  As part of our continuing mission to provide you with exceptional heart care, we have created designated Provider Care Teams.  These Care Teams include your primary Cardiologist (physician) and Advanced Practice Providers (APPs -  Physician Assistants and Nurse Practitioners) who all work together to provide you with the care you need, when you need it. ? ?We recommend signing up for the patient portal called "MyChart".  Sign up information is provided on this After Visit Summary.  MyChart is used to connect with patients for Virtual Visits (Telemedicine).  Patients are able to view lab/test results, encounter notes, upcoming appointments, etc.  Non-urgent messages can be sent to your provider as well.   ?To learn more about what you can do with MyChart, go to [email or web address].   ? ?Your next appointment:   ?1 month(s) ? ?The format for your next appointment:   ?In Person ? ?Provider:   ?Peter Martinique, MD   ? ? ? ? ?Important Information About Sugar ? ? ? ? ?  ?

## 2021-08-19 NOTE — Progress Notes (Signed)
?Cardiology Office Note:   ? ?Date:  08/19/2021  ? ?ID:  Jeffery Stone, DOB 06/23/1955, MRN [id number] ? ?PCP:  [facility] [place] ?  ?[facility] Providers ?Cardiologist:  Peter Martinique, MD { ?Referring MD: [facility]  ? ?Chief Complaint  ?Patient presents with  ? Follow-up  ?  Chest pain  ? ? ?History of Present Illness:   ? ?Jeffery Stone is a 66 y.o. male with a hx of DM2, HLD, HTN, LBBB, OSA, aortic atherosclerosis on CT, cardiomyopathy with LVEF 40-45% with a nonischemic myoview 2006, presumed NICM. NICM felt related at least in part to his LBBB.  ? ?Myoview 07/2019 with fixed defect felt related to LBBB, no ischemia. EF 42%. Echocardiogram at that time with LVEF 35-40% with no significant valvular disease. [name] was started in addition to his coreg. He has a history of elevated CK felt not cardiac - statin was D/C'ed.  On zetia and tricore. Rheumatology workup negative.  ? ?He was last seen by Dr. Martinique 07/28/2021 with some lightheadedness.  Coreg was reduced to 3.125 mg BID and entresto continued. Soon after this visit, he presented to [facility] 08/15/21 with chest pain. He ruled out with negative enzymes and nonischemic EKG.  ? ?He presents for follow up. He recounts CP x 8 times in 1 hour on Thursday night. He also ate [employer] that night. He was able to sleep that night but woke up at 5:30 am Friday morning with chest pain, CP did not wake him from sleep, prompting ER evaluation. He reports CP x 3 on Saturday.  On Sunday he felt bad and had some dizziness. He did walk 1.5 miles on Sunday. He has felt great yesterday and today. He mowed yesterday (ride mow) without chest pain, no CP today. He was patching ceiling holes today without chest pain.  ? ?We had a long discussion regarding his workup in the ER and nonexertional symptoms. CP was located in his left epigastric region, under his ribs in the area of his stomach. He also had BBQ that evening,  CP/indigestion lasted for three days. However, he was still able to walk 1.5 miles and sleep.  ? ? ?Past Medical History:  ?Diagnosis Date  ? Diabetes mellitus   ? type 2  ? Hypercholesterolemia   ? Hypertension   ? LBBB (left bundle branch block)   ? Nonischemic dilated cardiomyopathy ([place])   ? Est. EF of 40-45%  ? Sleep apnea   ? ? ?Past Surgical History:  ?Procedure Laterality Date  ? BACK SURGERY    ? ? ?Current Medications: ?Current Meds  ?Medication Sig  ? aspirin EC 81 MG tablet Take 81 mg by mouth daily.  ? Haematologist lancets USE TO TEST BLOOD SUGAR ONCE DAILY E11.9  ? carvedilol (COREG) 3.125 MG tablet Take 1 tablet (3.125 mg total) by mouth 2 (two) times daily.  ? Cholecalciferol (VITAMIN D3) 25 MCG (1000 UT) CAPS Take 1,000 Units by mouth daily.  ? ENTRESTO 49-51 MG TAKE 1 TABLET BY MOUTH TWICE A DAY  ? ezetimibe (ZETIA) 10 MG tablet TAKE 1 TABLET BY MOUTH EVERY DAY  ? fenofibrate (TRICOR) 145 MG tablet Take 145 mg by mouth daily.    ? Ferrous Sulfate (IRON) 325 (65 Fe) MG TABS 1 tablet  ? multivitamin (THERAGRAN) per tablet Take 1 tablet by mouth daily.    ? nitroGLYCERIN (NITROSTAT) 0.4 MG SL tablet Place 1 tablet (0.4 mg total) under  the tongue every 5 (five) minutes as needed for chest pain.  ? ONETOUCH ULTRA test strip daily.  ? pantoprazole (PROTONIX) 40 MG tablet Take 1 tablet (40 mg total) by mouth as needed (For Indigestion).  ? sildenafil (VIAGRA) 100 MG tablet   ? Triprolidine-Pseudoephedrine (ANTIHISTAMINE PO) Take by mouth daily.  ?  ? ?Allergies:   Poison ivy extract and Simvastatin  ? ?Social History  ? ?Socioeconomic History  ? Marital status: Married  ?  Spouse name: Not on file  ? Number of children: 2  ? Years of education: Not on file  ? Highest education level: Not on file  ?Occupational History  ? Occupation: Artist firm  ?Tobacco Use  ? Smoking status: Never  ?  Passive exposure: Never  ? Smokeless tobacco: Never  ?Vaping Use  ? Vaping Use: Never  used  ?Substance and Sexual Activity  ? Alcohol use: No  ? Drug use: No  ? Sexual activity: Not on file  ?Other Topics Concern  ? Not on file  ?Social History Narrative  ? Not on file  ? ?Social Determinants of Health  ? ?Financial Resource Strain: Not on file  ?Food Insecurity: Not on file  ?Transportation Needs: Not on file  ?Physical Activity: Not on file  ?Stress: Not on file  ?Social Connections: Not on file  ?  ? ?Family History: ?The patient's family history includes Cancer in his father; Dementia in his mother; Diabetes in his father; Healthy in his son and son; Heart attack in his brother; Hypertension in his father and mother; Liver cancer in his mother; Stroke in his mother; Transient ischemic attack in his mother. ? ?ROS:   ?Please see the history of present illness.    ? All other systems reviewed and are negative. ? ?EKGs/Labs/Other Studies Reviewed:   ? ?The following studies were reviewed today: ? ?Echo 2021 ? 1. Left ventricular ejection fraction, by estimation, is 35 to 40%. The  ?left ventricle has moderately decreased function. The left ventricle  ?demonstrates global hypokinesis. The left ventricular internal cavity size  ?was mildly dilated. Left ventricular  ?diastolic parameters are indeterminate.  ? 2. Right ventricular systolic function is normal. The right ventricular  ?size is normal.  ? 3. The mitral valve is normal in structure. Mild mitral valve  ?regurgitation.  ? 4. The aortic valve is normal in structure. Aortic valve regurgitation is  ?not visualized.  ? ?EKG:  EKG is not ordered today.   ? ?Recent Labs: ?08/15/2021: BUN 30; Creatinine, Ser 1.32; Hemoglobin 12.1; Platelets 266; Potassium 4.2; Sodium 139  ?Recent Lipid Panel ?   ?Component Value Date/Time  ? CHOL 179 09/29/2019 0823  ? TRIG 109 09/29/2019 0823  ? HDL 30 (L) 09/29/2019 2585  ? CHOLHDL 6.0 (H) 09/29/2019 2778  ? LDLCALC 129 (H) 09/29/2019 2423  ? ? ? ?Risk Assessment/Calculations:   ?  ? ?    ? ?Physical Exam:   ? ?VS:   BP (!) 114/56   Pulse 70   Ht '6\' 2"'$  (1.88 m)   Wt 184 lb (83.5 kg)   SpO2 99%   BMI 23.62 kg/m?    ? ?Wt Readings from Last 3 Encounters:  ?08/19/21 184 lb (83.5 kg)  ?08/15/21 184 lb (83.5 kg)  ?08/12/21 184 lb (83.5 kg)  ?  ? ?GEN:  Well nourished, well developed in no acute distress ?HEENT: Normal ?NECK: No JVD; No carotid bruits ?LYMPHATICS: No lymphadenopathy ?CARDIAC: RRR, no murmurs, rubs, gallops ?  RESPIRATORY:  Clear to auscultation without rales, wheezing or rhonchi  ?ABDOMEN: Soft, non-tender, non-distended ?MUSCULOSKELETAL:  No edema; No deformity  ?SKIN: Warm and dry ?NEUROLOGIC:  Alert and oriented x 3 ?PSYCHIATRIC:  Normal affect  ? ?ASSESSMENT:   ? ?1. Chest pain of uncertain etiology   ?2. LBBB (left bundle branch block)   ?3. Nonischemic dilated cardiomyopathy ([place])   ?4. Essential hypertension   ? ?PLAN:   ? ?In order of problems listed above: ? ?Chest pain ?In the left upper abdominal area, no radiation, not exertional ?Suspect this may be GI related ?Negative workup in the ER ?He has had no chest pain in the last 2 days ?We had a long discussion - through shared decision making we will trial PRN protonix. If CP unrelieved, will then use PRN nitro and call our office. He is agreeable to this plan. If he has return of symptoms, will then consider PET stress test given renal function.  ? ? ?LBBB ?NICM ?Hypertension ?Not volume up, no syncope.  ?Doing well on reduced doses of coreg.  ? ?   ? ?  Return in 1 month. May cancel if no further symptoms.  ? ? ?Medication Adjustments/Labs and Tests Ordered: ?Current medicines are reviewed at length with the patient today.  Concerns regarding medicines are outlined above.  ?No orders of the defined types were placed in this encounter. ? ?Meds ordered this encounter  ?Medications  ? pantoprazole (PROTONIX) 40 MG tablet  ?  Sig: Take 1 tablet (40 mg total) by mouth as needed (For Indigestion).  ?  Dispense:  30 tablet  ?  Refill:  2  ? nitroGLYCERIN  (NITROSTAT) 0.4 MG SL tablet  ?  Sig: Place 1 tablet (0.4 mg total) under the tongue every 5 (five) minutes as needed for chest pain.  ?  Dispense:  25 tablet  ?  Refill:  1  ? ? ?Patient Instructions  ?Medication Instructi

## 2021-08-25 ENCOUNTER — Ambulatory Visit: Payer: PPO | Admitting: Nurse Practitioner

## 2021-08-26 ENCOUNTER — Ambulatory Visit: Payer: PPO | Admitting: General Practice

## 2021-09-12 NOTE — Progress Notes (Unsigned)
Cardiology Office Note   Date:  09/12/2021   ID:  Jeffery Stone, DOB [birth year], MRN [id number]  PCP:  [facility] [place]  Cardiologist:   Keisa Blow Martinique, MD   No chief complaint on file.     History of Present Illness: Jeffery Stone is a 66 y.o. male who is seen for follow up of cardiomyopathy and chest pain. He has a history of DM type 2, HLD, HTN and LBBB. He has a history of OSA. Aortic atherosclerosis noted on CT.  Apparently has cardiomyopathy with EF 40-45%. Myoview perfusion study in 2006 showed no ischemia.   Recently he was seen for a routine physical. Noted some symptoms of chest pain around his ribs R>L. Noted more when doing yard work. On physical labs showed an Elevated CPK to 484. He was sent to the ED where troponin levels were normal x 2. Ecg showed his chronic LBBB. He states his CPK went down to 216 but then back up again. Simvastatin held. Has been seen by Dr Bronson Curb with no clear etiology found.   To further evaluate he had Myoview study in April showing a fixed septal perfusion abnormality felt to be c/w LBBB artifact. EF 42%. Subsequent Echo showed EF 35-40%. Global HK. Recommended starting on Entresto.   When I saw him in early April he was doing well. Noted some dizziness in the morning before his meds and his BP was fluctuating. He was seen at the end of April with chest pain at rest. This occurred after eating barbeque. Went to ED and work up there negative. Felt that his pain was somewhat atypical and maybe GI. He was given protonix to take PRN. If recurrent chest pain consider a PET stress test. He is seen today to follow up.    Past Medical History:  Diagnosis Date   Diabetes mellitus    type 2   Hypercholesterolemia    Hypertension    LBBB (left bundle branch block)    Nonischemic dilated cardiomyopathy ([facility])    Est. EF of 40-45%   Sleep apnea     Past Surgical History:  Procedure Laterality Date   BACK SURGERY        Current Outpatient Medications  Medication Sig Dispense Refill   aspirin EC 81 MG tablet Take 81 mg by mouth daily.     Bayer Microlet Lancets lancets USE TO TEST BLOOD SUGAR ONCE DAILY E11.9     carvedilol (COREG) 3.125 MG tablet Take 1 tablet (3.125 mg total) by mouth 2 (two) times daily. 180 tablet 3   Cholecalciferol (VITAMIN D3) 25 MCG (1000 UT) CAPS Take 1,000 Units by mouth daily.     ENTRESTO 49-51 MG TAKE 1 TABLET BY MOUTH TWICE A DAY 180 tablet 3   ezetimibe (ZETIA) 10 MG tablet TAKE 1 TABLET BY MOUTH EVERY DAY 90 tablet 3   fenofibrate (TRICOR) 145 MG tablet Take 145 mg by mouth daily.       Ferrous Sulfate (IRON) 325 (65 Fe) MG TABS 1 tablet     multivitamin (THERAGRAN) per tablet Take 1 tablet by mouth daily.       nitroGLYCERIN (NITROSTAT) 0.4 MG SL tablet Place 1 tablet (0.4 mg total) under the tongue every 5 (five) minutes as needed for chest pain. 25 tablet 1   ONETOUCH ULTRA test strip daily.     pantoprazole (PROTONIX) 40 MG tablet Take 1 tablet (40 mg total) by mouth as needed (For Indigestion). 30 tablet 2  sildenafil (VIAGRA) 100 MG tablet      Triprolidine-Pseudoephedrine (ANTIHISTAMINE PO) Take by mouth daily.     No current facility-administered medications for this visit.    Allergies:   Poison ivy extract and Simvastatin    Social History:  The patient  reports that he has never smoked. He has never been exposed to tobacco smoke. He has never used smokeless tobacco. He reports that he does not drink alcohol and does not use drugs.   Family History:  The patient's family history includes Cancer in his father; Dementia in his mother; Diabetes in his father; Healthy in his son and son; Heart attack in his brother; Hypertension in his father and mother; Liver cancer in his mother; Stroke in his mother; Transient ischemic attack in his mother.    ROS:  Please see the history of present illness.   Otherwise, review of systems are positive for none.   All  other systems are reviewed and negative.    PHYSICAL EXAM: VS:  There were no vitals taken for this visit. , BMI There is no height or weight on file to calculate BMI. GEN: Well nourished, well developed, in no acute distress  HEENT: normal  Neck: no JVD, carotid bruits, or masses Cardiac: RRR; no murmurs, rubs, or gallops,no edema  Respiratory:  clear to auscultation bilaterally, normal work of breathing GI: soft, nontender, nondistended, + BS MS: no deformity or atrophy  Skin: warm and dry, multiple chigger bites on LE Neuro:  Strength and sensation are intact Psych: euthymic mood, full affect   EKG:  EKG is ordered today. NSR rate 74. LBBB. I have personally reviewed and interpreted this study.    Recent Labs: 08/15/2021: BUN 30; Creatinine, Ser 1.32; Hemoglobin 12.1; Platelets 266; Potassium 4.2; Sodium 139    Lipid Panel    Component Value Date/Time   CHOL 179 09/29/2019 0823   TRIG 109 09/29/2019 0823   HDL 30 (L) 09/29/2019 0823   CHOLHDL 6.0 (H) 09/29/2019 0823   LDLCALC 129 (H) 09/29/2019 0823     Dated 07/06/19: cholesterol 136, triglycerides 81, HDL 32, LDL 71. A1c 6%. Creatinine 1.2.. Hgb 12. Otherwise CMET and CBC normal.  Dated 07/16/21: cholesterol 161, triglycerides 125, HDL 32, LDL 106. A1c 6%. CMET normal   Wt Readings from Last 3 Encounters:  08/19/21 184 lb (83.5 kg)  08/15/21 184 lb (83.5 kg)  08/12/21 184 lb (83.5 kg)      Other studies Reviewed: Additional studies/ records that were reviewed today include:   Myoview 08/16/19: Study Highlights    The left ventricular ejection fraction is moderately decreased (30-44%). Nuclear stress EF: 42%. There was no ST segment deviation noted during stress. Defect 1: There is a large defect of mild severity present in the mid anteroseptal, mid inferoseptal and apical septal location. This is an intermediate risk study.   Intermediate risk stress nuclear study due to moderately reduced left ventricular  systolic function. There is a fixed septal perfusion defect consistent with LBBB-related artifact. No reversible ischemia is seen.    Echo 08/21/19: IMPRESSIONS     1. Left ventricular ejection fraction, by estimation, is 35 to 40%. The  left ventricle has moderately decreased function. The left ventricle  demonstrates global hypokinesis. The left ventricular internal cavity size  was mildly dilated. Left ventricular  diastolic parameters are indeterminate.   2. Right ventricular systolic function is normal. The right ventricular  size is normal.   3. The mitral valve is normal in  structure. Mild mitral valve  regurgitation.   4. The aortic valve is normal in structure. Aortic valve regurgitation is  not visualized.    ASSESSMENT AND PLAN:  1. History of nonischemic CM with EF 40-45%. Currently class 1. I suspect this is at least partly related to dyssynchrony with his LBBB. Also may be related to HTN and DM. Now EF 42% by Myoview and 35-40% by Echo. On Entresto. Recommend reducing Coreg to 3.125 mg  bid due to lightheadedness.  3. Elevated CPK. Not cardiac. Now off statin. Seen by Rheumatology and evaluation negative.  4. LBBB 5. DM type 2 6. HTN- controlled.   7. HLD off statin due to elevated CPK. On fenofibrate and Zetia. LDL 108 8. Aortic atherosclerosis. Noted on CT.    Current medicines are reviewed at length with the patient today.  The patient has no concerns regarding medicines.  The following changes have been made:  no change  Labs/ tests ordered today include:   No orders of the defined types were placed in this encounter.    Disposition:   FU with me one year.  Signed, Kieanna Rollo Martinique, MD  09/12/2021 4:36 PM    [facility] [address] [place], [place], [address] Phone [phone], Fax [phone]

## 2021-09-16 DIAGNOSIS — I10 Essential (primary) hypertension: Secondary | ICD-10-CM | POA: Diagnosis not present

## 2021-09-16 DIAGNOSIS — E1122 Type 2 diabetes mellitus with diabetic chronic kidney disease: Secondary | ICD-10-CM | POA: Diagnosis not present

## 2021-09-16 DIAGNOSIS — E7800 Pure hypercholesterolemia, unspecified: Secondary | ICD-10-CM | POA: Diagnosis not present

## 2021-09-19 ENCOUNTER — Ambulatory Visit: Payer: PPO | Admitting: Cardiology

## 2021-11-11 DIAGNOSIS — E119 Type 2 diabetes mellitus without complications: Secondary | ICD-10-CM | POA: Diagnosis not present

## 2021-11-11 DIAGNOSIS — H26492 Other secondary cataract, left eye: Secondary | ICD-10-CM | POA: Diagnosis not present

## 2021-11-11 DIAGNOSIS — Z961 Presence of intraocular lens: Secondary | ICD-10-CM | POA: Diagnosis not present

## 2021-11-11 DIAGNOSIS — H5203 Hypermetropia, bilateral: Secondary | ICD-10-CM | POA: Diagnosis not present

## 2021-11-17 ENCOUNTER — Other Ambulatory Visit: Payer: Self-pay

## 2021-11-17 MED ORDER — PANTOPRAZOLE SODIUM 40 MG PO TBEC: 40.0000 mg | DELAYED_RELEASE_TABLET | ORAL | 2 refills | Status: AC | PRN

## 2022-02-10 ENCOUNTER — Other Ambulatory Visit: Payer: Self-pay | Admitting: Cardiology

## 2022-02-17 DIAGNOSIS — N401 Enlarged prostate with lower urinary tract symptoms: Secondary | ICD-10-CM | POA: Diagnosis not present

## 2022-02-17 DIAGNOSIS — R3914 Feeling of incomplete bladder emptying: Secondary | ICD-10-CM | POA: Diagnosis not present

## 2022-02-17 DIAGNOSIS — R350 Frequency of micturition: Secondary | ICD-10-CM | POA: Diagnosis not present

## 2022-02-17 DIAGNOSIS — R3915 Urgency of urination: Secondary | ICD-10-CM | POA: Diagnosis not present

## 2022-04-14 DIAGNOSIS — R3914 Feeling of incomplete bladder emptying: Secondary | ICD-10-CM | POA: Diagnosis not present

## 2022-04-14 DIAGNOSIS — R350 Frequency of micturition: Secondary | ICD-10-CM | POA: Diagnosis not present

## 2022-04-14 DIAGNOSIS — N3941 Urge incontinence: Secondary | ICD-10-CM | POA: Diagnosis not present

## 2022-04-14 DIAGNOSIS — N401 Enlarged prostate with lower urinary tract symptoms: Secondary | ICD-10-CM | POA: Diagnosis not present

## 2022-05-15 IMAGING — DX DG CHEST 2V
2 series · 2 of 2 positions shown · non-contrast
Comparison: Chest x-ray dated July 06, 2019

CLINICAL DATA: Chest pain

EXAM:
CHEST - 2 VIEW

[chest pa]
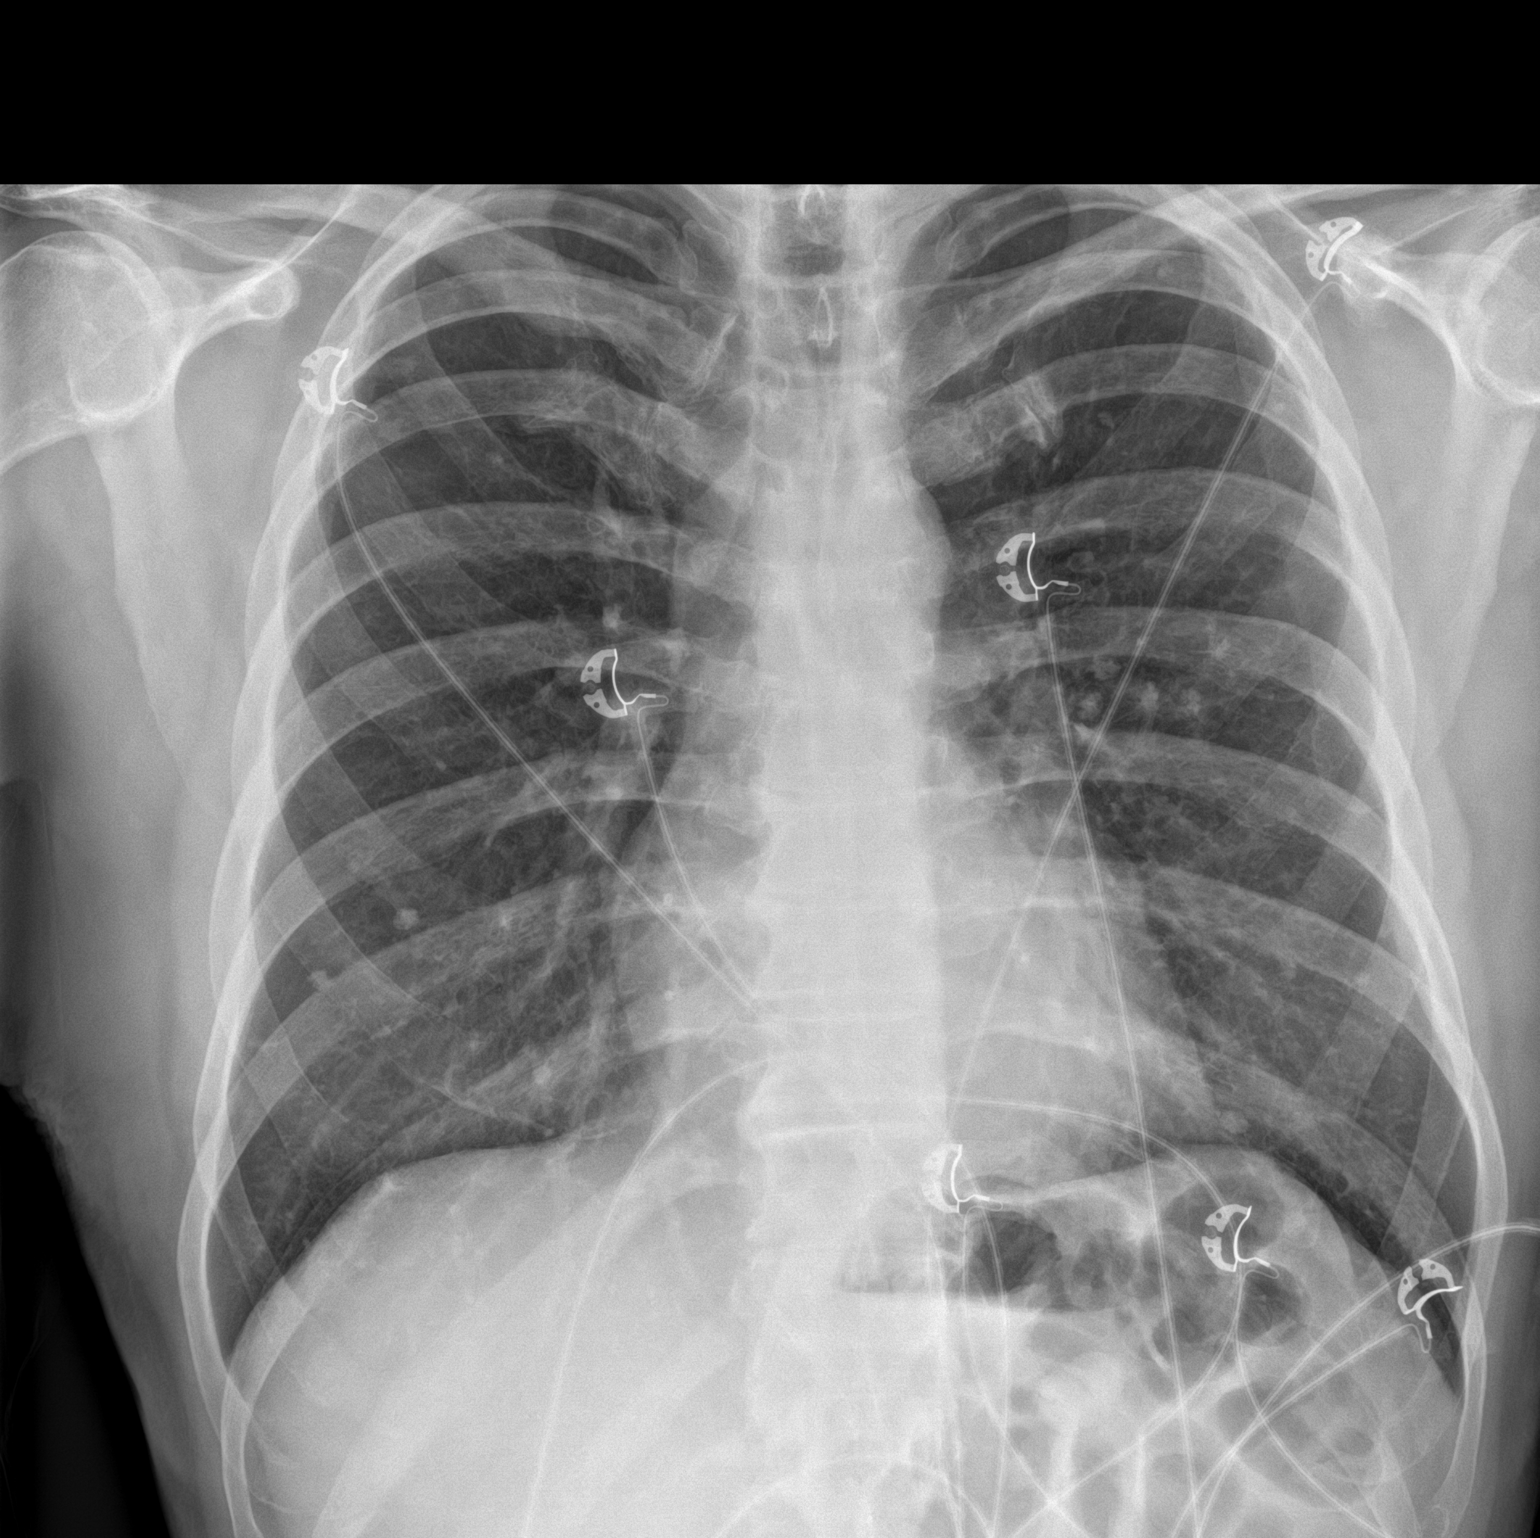

[chest lat]
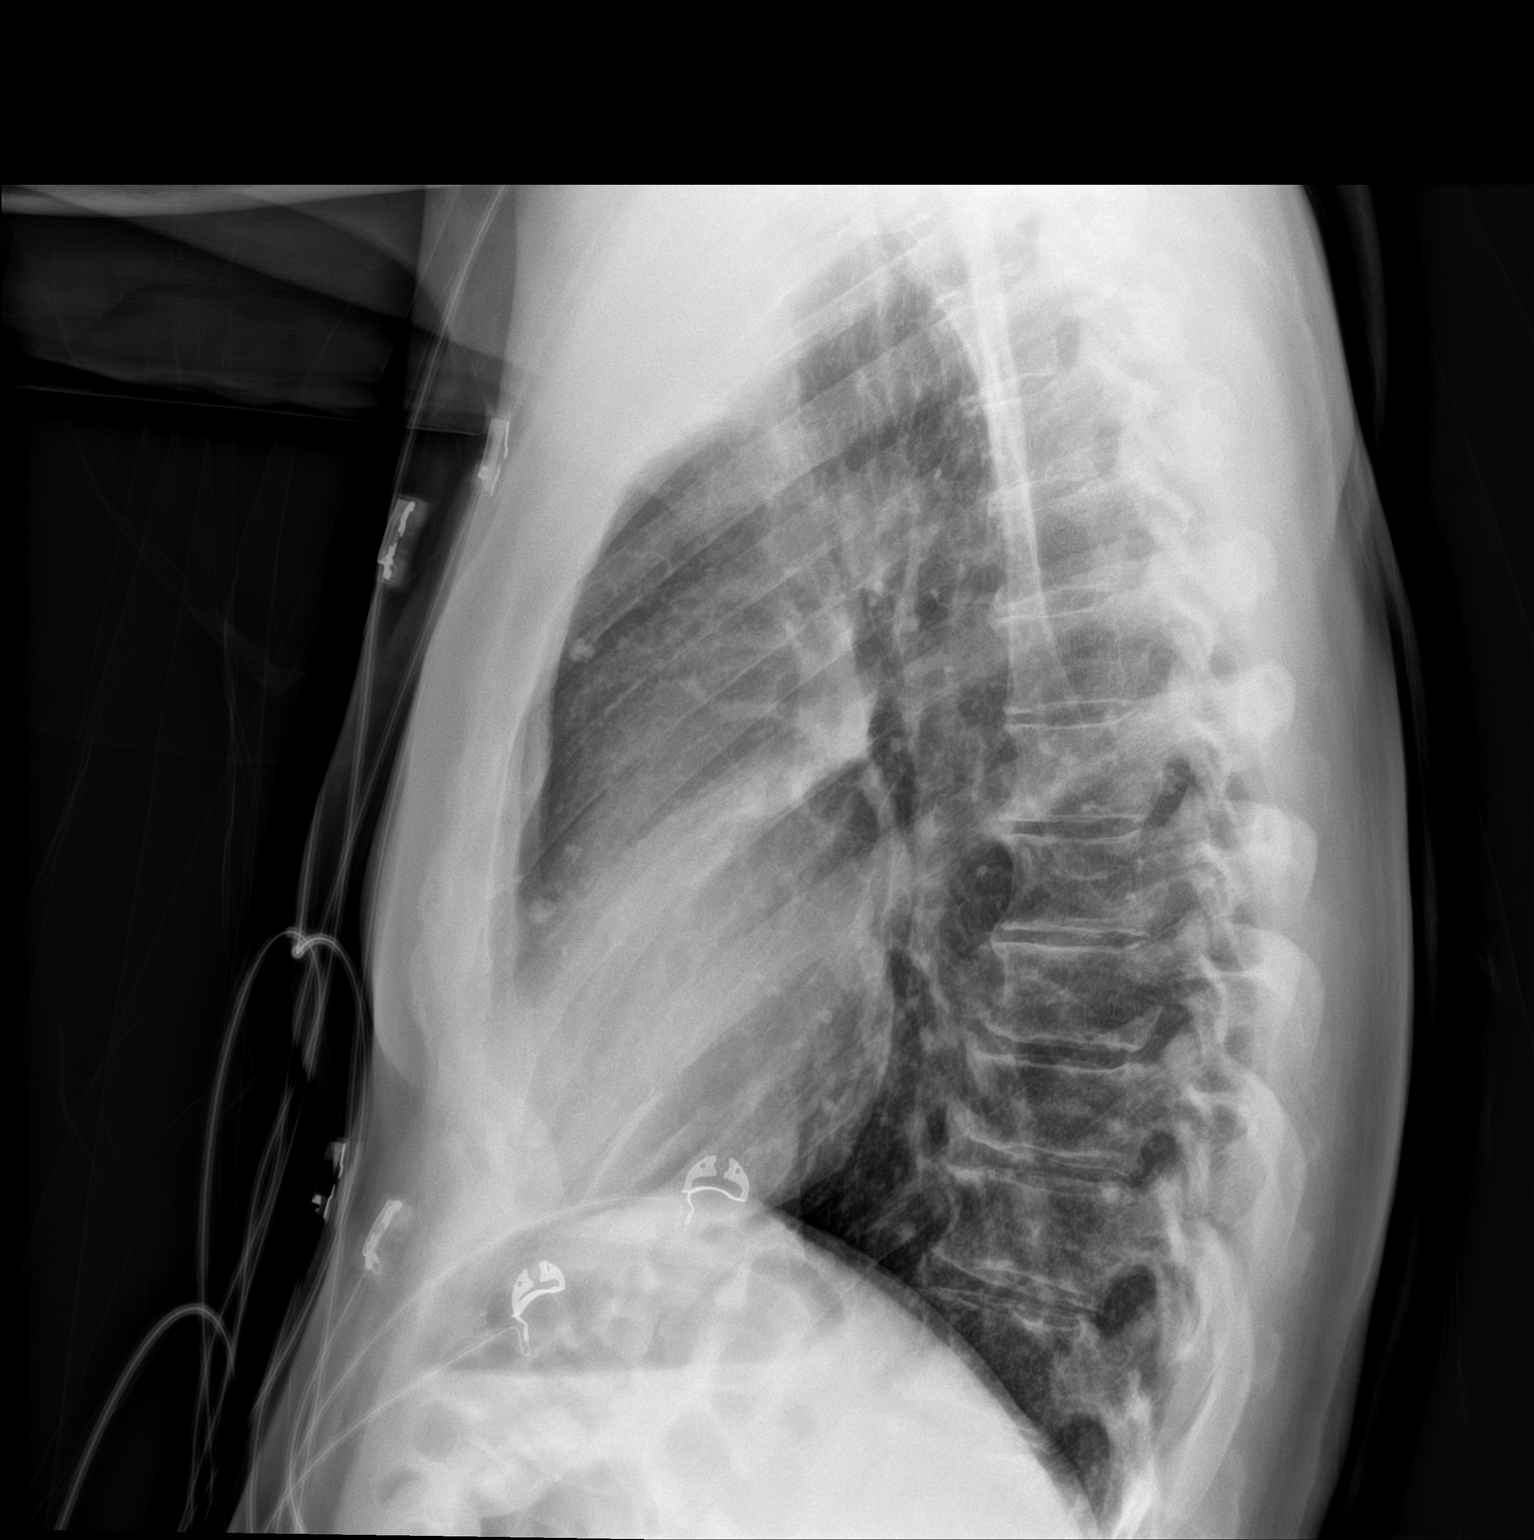

[2 of 2 positions shown; findings below may reference images not displayed]

FINDINGS: Cardiac and mediastinal contours are within normal limits. Bilateral
calcified pulmonary nodules, likely granulomas. Lungs otherwise
clear. No pleural effusion or pneumothorax.
IMPRESSION: No active cardiopulmonary disease.

## 2022-07-15 DIAGNOSIS — R3914 Feeling of incomplete bladder emptying: Secondary | ICD-10-CM | POA: Diagnosis not present

## 2022-07-15 DIAGNOSIS — N3941 Urge incontinence: Secondary | ICD-10-CM | POA: Diagnosis not present

## 2022-07-15 DIAGNOSIS — N401 Enlarged prostate with lower urinary tract symptoms: Secondary | ICD-10-CM | POA: Diagnosis not present

## 2022-07-20 DIAGNOSIS — E1122 Type 2 diabetes mellitus with diabetic chronic kidney disease: Secondary | ICD-10-CM | POA: Diagnosis not present

## 2022-07-20 DIAGNOSIS — G4733 Obstructive sleep apnea (adult) (pediatric): Secondary | ICD-10-CM | POA: Diagnosis not present

## 2022-07-20 DIAGNOSIS — I447 Left bundle-branch block, unspecified: Secondary | ICD-10-CM | POA: Diagnosis not present

## 2022-07-20 DIAGNOSIS — I129 Hypertensive chronic kidney disease with stage 1 through stage 4 chronic kidney disease, or unspecified chronic kidney disease: Secondary | ICD-10-CM | POA: Diagnosis not present

## 2022-07-20 DIAGNOSIS — Z125 Encounter for screening for malignant neoplasm of prostate: Secondary | ICD-10-CM | POA: Diagnosis not present

## 2022-07-20 DIAGNOSIS — E559 Vitamin D deficiency, unspecified: Secondary | ICD-10-CM | POA: Diagnosis not present

## 2022-07-20 DIAGNOSIS — Z0000 Encounter for general adult medical examination without abnormal findings: Secondary | ICD-10-CM | POA: Diagnosis not present

## 2022-07-20 DIAGNOSIS — D649 Anemia, unspecified: Secondary | ICD-10-CM | POA: Diagnosis not present

## 2022-07-20 DIAGNOSIS — I10 Essential (primary) hypertension: Secondary | ICD-10-CM | POA: Diagnosis not present

## 2022-07-20 DIAGNOSIS — E119 Type 2 diabetes mellitus without complications: Secondary | ICD-10-CM | POA: Diagnosis not present

## 2022-07-20 DIAGNOSIS — Z9181 History of falling: Secondary | ICD-10-CM | POA: Diagnosis not present

## 2022-07-20 DIAGNOSIS — E7800 Pure hypercholesterolemia, unspecified: Secondary | ICD-10-CM | POA: Diagnosis not present

## 2022-07-20 DIAGNOSIS — N183 Chronic kidney disease, stage 3 unspecified: Secondary | ICD-10-CM | POA: Diagnosis not present

## 2022-07-20 DIAGNOSIS — I700 Atherosclerosis of aorta: Secondary | ICD-10-CM | POA: Diagnosis not present

## 2022-07-20 DIAGNOSIS — I420 Dilated cardiomyopathy: Secondary | ICD-10-CM | POA: Diagnosis not present

## 2022-08-06 ENCOUNTER — Telehealth: Payer: Self-pay | Admitting: Cardiology

## 2022-08-06 NOTE — Telephone Encounter (Signed)
Called patient, advised that I did not see any recent labs recommended, he could come to the appointment and they could do the labs here for that visit.   Patient verbalized understanding.

## 2022-08-06 NOTE — Telephone Encounter (Signed)
Patient wants to know if he will need to do blood work prior to his visit on 5/16.

## 2022-08-18 ENCOUNTER — Other Ambulatory Visit: Payer: Self-pay | Admitting: Cardiology

## 2022-08-24 DIAGNOSIS — D225 Melanocytic nevi of trunk: Secondary | ICD-10-CM | POA: Diagnosis not present

## 2022-08-24 DIAGNOSIS — D485 Neoplasm of uncertain behavior of skin: Secondary | ICD-10-CM | POA: Diagnosis not present

## 2022-08-24 DIAGNOSIS — Z1283 Encounter for screening for malignant neoplasm of skin: Secondary | ICD-10-CM | POA: Diagnosis not present

## 2022-08-24 DIAGNOSIS — X32XXXA Exposure to sunlight, initial encounter: Secondary | ICD-10-CM | POA: Diagnosis not present

## 2022-08-24 DIAGNOSIS — L570 Actinic keratosis: Secondary | ICD-10-CM | POA: Diagnosis not present

## 2022-09-08 NOTE — Progress Notes (Unsigned)
Cardiology Clinic Note   Date: 09/10/2022 ID: Jeffery Stone, DOB Aug 28, 1955, MRN [id number]  Primary Cardiologist:  Peter Swaziland, MD  Patient Profile    Jeffery Stone is a 67 y.o. male who presents to the clinic today for 1 year follow-up.   Past medical history significant for: Nonischemic cardiomyopathy. Nuclear stress test 08/16/2019: Intermediate risk due to moderately reduced LV systolic function, there is a fixed septal perfusion defect consistent with LBBB related artifact.  No reversible ischemia seen. Echo 08/21/2019: EF 35 to 40%.  Global hypokinesis.  Normal RV function.  Mild MR. LBBB. Hypertension. Hyperlipidemia. Lipid panel 07/16/2021: LDL 106, HDL 32, TG 125, total 161. T2DM.   History of Present Illness    Jeffery Stone is a longtime patient of Dr. Swaziland.  He was lost in follow-up after September 2012 and was not seen again until 08/10/2019 for evaluation of cardiomyopathy and chest pain at the request of Dr. Zachery Dauer.  He underwent nuclear stress testing which showed an intermediate risk secondary to reduced LV function but no reversible ischemia.  Echo showed EF of 35 to 40%.  LV dysfunction was suspected to be related to dyssynchrony with LBBB.  Patient was started on GDMT at follow-up.  Patient was last seen in the office by Bettina Gavia, PA-C on 08/19/2021 with complaints of chest pain thought to be GI related.  It was decided to trial as needed Protonix and if chest pain unrelieved utilize as needed NTG can call the office.  Return visit planned in 1 month if needed with the option of canceling if symptoms resolved.  Today, patient is here alone. He reports overall he feels he is doing well. He reports he took two doses of Protonix after his last visit and did not have any more chest pain. He reports positional dizziness as well as feeling unsteady on his feet "it is like my stride isn't as long as it used to be because I don't have good balance." He is very active hiking once a  week and doing yard work and other chores around the house. He reports recently traveling to [place] to hike [facility] and had "normal" dyspnea on his way [place] the [place] but otherwise he tolerated the hike well. His biggest concern is the positional dizziness. BP is soft today at 98/56. He has not taken medications yet this morning. SBP at home typically 90-100. He denies lower extremity edema, orthopnea or PND. No abdominal fullness/bloating or early satiety. Home weight is stable.    ROS: All other systems reviewed and are otherwise negative except as noted in History of Present Illness.  Studies Reviewed    ECG personally reviewed by me today: NSR, LBBB, 68 bpm.  No significant changes from 08/18/2021.      Physical Exam    VS:  BP (!) 98/56 (BP Location: Left Arm, Patient Position: Sitting, Cuff Size: Normal)   Ht 6\' 2"  (1.88 m)   Wt 188 lb 6.4 oz (85.5 kg)   SpO2 98%   BMI 24.19 kg/m  , BMI Body mass index is 24.19 kg/m.  Orthostatic VS for the past 24 hrs (Last 3 readings):  BP- Lying Pulse- Lying BP- Sitting Pulse- Sitting BP- Standing at 0 minutes Pulse- Standing at 0 minutes BP- Standing at 3 minutes Pulse- Standing at 3 minutes  09/10/22 1010 94/58 61 (!) 86/48 75 (!) 85/60 88 102/68 81     GEN: Well nourished, well developed, in no acute distress. Neck: No JVD or  carotid bruits. Cardiac:  RRR. No murmurs. No rubs or gallops.   Respiratory:  Respirations regular and unlabored. Clear to auscultation without rales, wheezing or rhonchi. GI: Soft, nontender, nondistended. Extremities: Radials/DP/PT 2+ and equal bilaterally. No clubbing or cyanosis. No edema  Skin: Warm and dry, no rash. Neuro: Strength intact.  Assessment & Plan    Nonischemic cardiomyopathy.  Echo April 2021 showed EF 35 to 40%, global hypokinesis.  LV dysfunction attributed to dyssynchrony from LBBB.  Patient denies lower extremity edema, DOE, orthopnea, PND, abdominal bloating/fullness, or early satiety.  Home weight is stable.  Euvolemic and well compensated on exam.  Continue carvedilol, Entresto (see #2). Orthostasis/positional dizziness. BP today 98/56 prior to medications. Home SBP 90-100. Patient reports positional dizziness. Patient feels he is unsteady on his feet and his stride has changed because he does not feel he has ood balance. Orthostatic vitals show a drop of SBP to 85 without dizziness. Will decrease Entresto to 24-26 mg bid. Patient will keep a 1 week BP log and contact the office with the readings. Continue carvedilol. Hyperlipidemia.  LDL March 2023 106.  Continue Zetia. Will get fasting lipid panel and CMP today.   Disposition: Decrease Entresto to 24-26 mg bid. Call in 1 week BP log. Return in 3 months or sooner as needed.          Signed, Etta Grandchild. Zi Sek, DNP, NP-C

## 2022-09-10 ENCOUNTER — Encounter: Payer: Self-pay | Admitting: Student

## 2022-09-10 ENCOUNTER — Ambulatory Visit: Payer: PPO | Attending: Student | Admitting: Student

## 2022-09-10 VITALS — BP 98/56 | Ht 74.0 in | Wt 188.4 lb

## 2022-09-10 DIAGNOSIS — I951 Orthostatic hypotension: Secondary | ICD-10-CM

## 2022-09-10 DIAGNOSIS — Z79899 Other long term (current) drug therapy: Secondary | ICD-10-CM | POA: Diagnosis not present

## 2022-09-10 DIAGNOSIS — I10 Essential (primary) hypertension: Secondary | ICD-10-CM | POA: Diagnosis not present

## 2022-09-10 DIAGNOSIS — E782 Mixed hyperlipidemia: Secondary | ICD-10-CM

## 2022-09-10 DIAGNOSIS — I420 Dilated cardiomyopathy: Secondary | ICD-10-CM

## 2022-09-10 DIAGNOSIS — E785 Hyperlipidemia, unspecified: Secondary | ICD-10-CM | POA: Diagnosis not present

## 2022-09-10 MED ORDER — SACUBITRIL-VALSARTAN 24-26 MG PO TABS: 1.0000 | ORAL_TABLET | Freq: Two times a day (BID) | ORAL | 3 refills | Status: DC

## 2022-09-10 NOTE — Patient Instructions (Signed)
Medication Instructions:  Your physician has recommended you make the following change in your medication:  DECREASE: Entresto 24-26 twice daily.  *If you need a refill on your cardiac medications before your next appointment, please call your pharmacy*  Please take your blood pressure daily for 1 week and send in a MyChart message or give our office a call. Please include heart rates. ( Please take your blood pressure a few hours after you've taken you medication)   HOW TO TAKE YOUR BLOOD PRESSURE: Rest 5 minutes before taking your blood pressure. Don't smoke or drink caffeinated beverages for at least 30 minutes before. Take your blood pressure before (not after) you eat. Sit comfortably with your back supported and both feet on the floor (don't cross your legs). Elevate your arm to heart level on a table or a desk. Use the proper sized cuff. It should fit smoothly and snugly around your bare upper arm. There should be enough room to slip a fingertip under the cuff. The bottom edge of the cuff should be 1 inch above the crease of the elbow. Ideally, take 3 measurements at one sitting and record the average.    Lab Work: Your physician recommends that you have the following labs drawn today: Lipid and CMP  If you have labs (blood work) drawn today and your tests are completely normal, you will receive your results only by: MyChart Message (if you have MyChart) OR A paper copy in the mail If you have any lab test that is abnormal or we need to change your treatment, we will call you to review the results.   Testing/Procedures: NONE   Follow-Up: At [facility], you and your health needs are our priority.  As part of our continuing mission to provide you with exceptional heart care, we have created designated Provider Care Teams.  These Care Teams include your primary Cardiologist (physician) and Advanced Practice Providers (APPs -  Physician Assistants and Nurse Practitioners)  who all work together to provide you with the care you need, when you need it.  We recommend signing up for the patient portal called "MyChart".  Sign up information is provided on this After Visit Summary.  MyChart is used to connect with patients for Virtual Visits (Telemedicine).  Patients are able to view lab/test results, encounter notes, upcoming appointments, etc.  Non-urgent messages can be sent to your provider as well.   To learn more about what you can do with MyChart, go to [email or web address].    Your next appointment:   3 month(s)  Provider:   Peter Swaziland, MD

## 2022-09-11 LAB — COMPREHENSIVE METABOLIC PANEL
ALT: 20 IU/L (ref 0–44)
AST: 23 IU/L (ref 0–40)
Albumin/Globulin Ratio: 1.9 (ref 1.2–2.2)
Albumin: 4.6 g/dL (ref 3.9–4.9)
Alkaline Phosphatase: 34 IU/L — ABNORMAL LOW (ref 44–121)
BUN/Creatinine Ratio: 23 (ref 10–24)
BUN: 26 mg/dL (ref 8–27)
Bilirubin Total: 0.5 mg/dL (ref 0.0–1.2)
CO2: 25 mmol/L (ref 20–29)
Calcium: 9.6 mg/dL (ref 8.6–10.2)
Chloride: 103 mmol/L (ref 96–106)
Creatinine, Ser: 1.15 mg/dL (ref 0.76–1.27)
Globulin, Total: 2.4 g/dL (ref 1.5–4.5)
Glucose: 93 mg/dL (ref 70–99)
Potassium: 4.7 mmol/L (ref 3.5–5.2)
Sodium: 139 mmol/L (ref 134–144)
Total Protein: 7 g/dL (ref 6.0–8.5)
eGFR: 69 mL/min/{1.73_m2} (ref >59)

## 2022-09-11 LAB — LIPID PANEL
Chol/HDL Ratio: 4.9 ratio (ref 0.0–5.0)
Cholesterol, Total: 157 mg/dL (ref 100–199)
HDL: 32 mg/dL — ABNORMAL LOW (ref >39)
LDL Chol Calc (NIH): 107 mg/dL — ABNORMAL HIGH (ref 0–99)
Triglycerides: 97 mg/dL (ref 0–149)
VLDL Cholesterol Cal: 18 mg/dL (ref 5–40)

## 2022-09-14 ENCOUNTER — Other Ambulatory Visit: Payer: Self-pay

## 2022-11-02 DIAGNOSIS — E119 Type 2 diabetes mellitus without complications: Secondary | ICD-10-CM | POA: Diagnosis not present

## 2022-11-02 DIAGNOSIS — R42 Dizziness and giddiness: Secondary | ICD-10-CM | POA: Diagnosis not present

## 2022-11-02 DIAGNOSIS — I420 Dilated cardiomyopathy: Secondary | ICD-10-CM | POA: Diagnosis not present

## 2022-11-02 DIAGNOSIS — Z1211 Encounter for screening for malignant neoplasm of colon: Secondary | ICD-10-CM | POA: Diagnosis not present

## 2022-11-13 DIAGNOSIS — E119 Type 2 diabetes mellitus without complications: Secondary | ICD-10-CM | POA: Diagnosis not present

## 2022-11-13 DIAGNOSIS — H26492 Other secondary cataract, left eye: Secondary | ICD-10-CM | POA: Diagnosis not present

## 2022-11-13 DIAGNOSIS — H1045 Other chronic allergic conjunctivitis: Secondary | ICD-10-CM | POA: Diagnosis not present

## 2022-11-13 DIAGNOSIS — Z961 Presence of intraocular lens: Secondary | ICD-10-CM | POA: Diagnosis not present

## 2023-01-06 DIAGNOSIS — D225 Melanocytic nevi of trunk: Secondary | ICD-10-CM | POA: Diagnosis not present

## 2023-01-06 DIAGNOSIS — L570 Actinic keratosis: Secondary | ICD-10-CM | POA: Diagnosis not present

## 2023-01-06 DIAGNOSIS — X32XXXD Exposure to sunlight, subsequent encounter: Secondary | ICD-10-CM | POA: Diagnosis not present

## 2023-01-18 DIAGNOSIS — E7800 Pure hypercholesterolemia, unspecified: Secondary | ICD-10-CM | POA: Diagnosis not present

## 2023-01-18 DIAGNOSIS — E119 Type 2 diabetes mellitus without complications: Secondary | ICD-10-CM | POA: Diagnosis not present

## 2023-01-18 DIAGNOSIS — J302 Other seasonal allergic rhinitis: Secondary | ICD-10-CM | POA: Diagnosis not present

## 2023-01-18 DIAGNOSIS — N183 Chronic kidney disease, stage 3 unspecified: Secondary | ICD-10-CM | POA: Diagnosis not present

## 2023-01-18 DIAGNOSIS — I10 Essential (primary) hypertension: Secondary | ICD-10-CM | POA: Diagnosis not present

## 2023-01-18 DIAGNOSIS — E1122 Type 2 diabetes mellitus with diabetic chronic kidney disease: Secondary | ICD-10-CM | POA: Diagnosis not present

## 2023-01-18 DIAGNOSIS — D649 Anemia, unspecified: Secondary | ICD-10-CM | POA: Diagnosis not present

## 2023-01-18 DIAGNOSIS — M791 Myalgia, unspecified site: Secondary | ICD-10-CM | POA: Diagnosis not present

## 2023-01-18 DIAGNOSIS — K760 Fatty (change of) liver, not elsewhere classified: Secondary | ICD-10-CM | POA: Diagnosis not present

## 2023-01-18 DIAGNOSIS — Z8051 Family history of malignant neoplasm of kidney: Secondary | ICD-10-CM | POA: Diagnosis not present

## 2023-01-18 DIAGNOSIS — Z6824 Body mass index (BMI) 24.0-24.9, adult: Secondary | ICD-10-CM | POA: Diagnosis not present

## 2023-01-18 DIAGNOSIS — Z23 Encounter for immunization: Secondary | ICD-10-CM | POA: Diagnosis not present

## 2023-02-09 NOTE — Progress Notes (Signed)
Cardiology Office Note   Date:  02/15/2023   ID:  Jeffery Stone, DOB Nov 17, 1955, MRN [id number]  PCP:  [facility] [place]  Cardiologist:   Jeffery Rasmus [place], MD   Chief Complaint  Patient presents with   Congestive Heart Failure      History of Present Illness: Jeffery Stone is a 67 y.o. male who is seen for follow up of cardiomyopathy and chest pain. He has a history of DM type 2, HLD, HTN and LBBB. He has a history of OSA. Aortic atherosclerosis noted on CT.  Apparently has cardiomyopathy with EF 40-45%. Myoview perfusion study in 2006 showed no ischemia.   Recently he was seen for a routine physical. Noted some symptoms of chest pain around his ribs R>L. Noted more when doing yard work. On physical labs showed an Elevated CPK to 484. He was sent to the ED where troponin levels were normal x 2. Ecg showed his chronic LBBB. He states his CPK went down to 216 but then back up again. Simvastatin held. Has been seen by Dr Jeffery Stone with no clear etiology found.   To further evaluate he had Myoview study in April 2021 showing a fixed septal perfusion abnormality felt to be c/w LBBB artifact. EF 42%. Subsequent Echo showed EF 35-40%. Global HK. Recommended starting on Entresto. On his last visit he did note some positional dizziness and Entresto dose was reduced.  On follow up today he is doing ok.  Still denies any chest pain, dyspnea. He continues to have some orthostatic dizziness especially in the morning. Was scheduled for colonoscopy but this was cancelled due to low BP.  No edema. He is active.     Past Medical History:  Diagnosis Date   Diabetes mellitus    type 2   Hypercholesterolemia    Hypertension    LBBB (left bundle branch block)    Nonischemic dilated cardiomyopathy (HCC)    Est. EF of 40-45%   Sleep apnea     Past Surgical History:  Procedure Laterality Date   BACK SURGERY       Current Outpatient Medications  Medication Sig Dispense Refill    alfuzosin (UROXATRAL) 10 MG 24 hr tablet Take 10 mg by mouth daily.     aspirin EC 81 MG tablet Take 81 mg by mouth daily.     Bayer Microlet Lancets lancets USE TO TEST BLOOD SUGAR ONCE DAILY E11.9     Cholecalciferol (VITAMIN D3) 25 MCG (1000 UT) CAPS Take 1,000 Units by mouth daily.     empagliflozin (JARDIANCE) 10 MG TABS tablet Take 1 tablet (10 mg total) by mouth daily before breakfast. 90 tablet 3   ezetimibe (ZETIA) 10 MG tablet TAKE 1 TABLET BY MOUTH EVERY DAY 90 tablet 1   fenofibrate (TRICOR) 145 MG tablet Take 145 mg by mouth daily.       Ferrous Sulfate (IRON) 325 (65 Fe) MG TABS 1 tablet     FEXOFENADINE HCL PO Take by mouth.     Melatonin 10 MG CHEW Chew 10 mg by mouth at bedtime.     multivitamin (THERAGRAN) per tablet Take 1 tablet by mouth daily.       ONETOUCH ULTRA test strip daily.     pantoprazole (PROTONIX) 40 MG tablet Take 1 tablet (40 mg total) by mouth as needed (For Indigestion). 30 tablet 2   sacubitril-valsartan (ENTRESTO) 24-26 MG Take 1 tablet by mouth 2 (two) times daily. 180 tablet 3  sildenafil (VIAGRA) 100 MG tablet      Triprolidine-Pseudoephedrine (ANTIHISTAMINE PO) Take by mouth daily.     nitroGLYCERIN (NITROSTAT) 0.4 MG SL tablet Place 1 tablet (0.4 mg total) under the tongue every 5 (five) minutes as needed for chest pain. 25 tablet 1   No current facility-administered medications for this visit.    Allergies:   Poison ivy extract and Simvastatin    Social History:  The patient  reports that he has never smoked. He has never been exposed to tobacco smoke. He has never used smokeless tobacco. He reports that he does not drink alcohol and does not use drugs.   Family History:  The patient's family history includes Cancer in his father; Dementia in his mother; Diabetes in his father; Healthy in his son and son; Heart attack in his brother; Hypertension in his father and mother; Liver cancer in his mother; Stroke in his mother; Transient ischemic  attack in his mother.    ROS:  Please see the history of present illness.   Otherwise, review of systems are positive for none.   All other systems are reviewed and negative.    PHYSICAL EXAM: VS:  BP (!) 100/50 (BP Location: Left Arm, Patient Position: Sitting, Cuff Size: Normal)   Pulse 74   Ht 6\' 2"  (1.88 m)   Wt 190 lb 3.2 oz (86.3 kg)   SpO2 98%   BMI 24.42 kg/m  , BMI Body mass index is 24.42 kg/m. GEN: Well nourished, well developed, in no acute distress  HEENT: normal  Neck: no JVD, carotid bruits, or masses Cardiac: RRR; no murmurs, rubs, or gallops,no edema  Respiratory:  clear to auscultation bilaterally, normal work of breathing GI: soft, nontender, nondistended, + BS MS: no deformity or atrophy  Skin: warm and dry, multiple chigger bites on LE Neuro:  Strength and sensation are intact Psych: euthymic mood, full affect   EKG:  EKG is not ordered today.     Recent Labs: 09/10/2022: ALT 20; BUN 26; Creatinine, Ser 1.15; Potassium 4.7; Sodium 139    Lipid Panel    Component Value Date/Time   CHOL 157 09/10/2022 1026   TRIG 97 09/10/2022 1026   HDL 32 (L) 09/10/2022 1026   CHOLHDL 4.9 09/10/2022 1026   LDLCALC 107 (H) 09/10/2022 1026     Dated 07/06/19: cholesterol 136, triglycerides 81, HDL 32, LDL 71. A1c 6%. Creatinine 1.2.. Hgb 12. Otherwise CMET and CBC normal.  Dated 07/16/21: cholesterol 161, triglycerides 125, HDL 32, LDL 106. A1c 6%. CMET normal Dated 01/18/23: cholesterol 157, triglycerides 97, HDL 32, LDL 107. A1c 6%. Hgb 11.9. otherwise CBC and CMET normal   Wt Readings from Last 3 Encounters:  02/15/23 190 lb 3.2 oz (86.3 kg)  09/10/22 188 lb 6.4 oz (85.5 kg)  08/19/21 184 lb (83.5 kg)      Other studies Reviewed: Additional studies/ records that were reviewed today include:   Myoview 08/16/19: Study Highlights    The left ventricular ejection fraction is moderately decreased (30-44%). Nuclear stress EF: 42%. There was no ST segment  deviation noted during stress. Defect 1: There is a large defect of mild severity present in the mid anteroseptal, mid inferoseptal and apical septal location. This is an intermediate risk study.   Intermediate risk stress nuclear study due to moderately reduced left ventricular systolic function. There is a fixed septal perfusion defect consistent with LBBB-related artifact. No reversible ischemia is seen.    Echo 08/21/19: IMPRESSIONS  1. Left ventricular ejection fraction, by estimation, is 35 to 40%. The  left ventricle has moderately decreased function. The left ventricle  demonstrates global hypokinesis. The left ventricular internal cavity size  was mildly dilated. Left ventricular  diastolic parameters are indeterminate.   2. Right ventricular systolic function is normal. The right ventricular  size is normal.   3. The mitral valve is normal in structure. Mild mitral valve  regurgitation.   4. The aortic valve is normal in structure. Aortic valve regurgitation is  not visualized.    ASSESSMENT AND PLAN:  1. History of nonischemic CM with EF 40-45%. Currently class 1. I suspect this is at least partly related to dyssynchrony with his LBBB. Also may be related to HTN and DM. Now EF 42% by Myoview and 35-40% by Echo. On Entresto. Recommend stopping  Coreg due to lightheadedness and low BP. Will add Jardiance 10 mg daily.  He should be Ok to proceed with colonoscopy.  3. Elevated CPK. Not cardiac. Now off statin. Seen by Rheumatology and evaluation negative.  4. LBBB 5. DM type 2 6. HTN- BP now low.  7. HLD off statin due to elevated CPK. On fenofibrate and Zetia. LDL 108 8. Aortic atherosclerosis. Noted on CT.    Current medicines are reviewed at length with the patient today.  The patient has no concerns regarding medicines.  The following changes have been made:  no change  Labs/ tests ordered today include:   No orders of the defined types were placed in this  encounter.    Disposition:   FU with me 6 months.  Signed, Jahaad Penado [place], MD  02/15/2023 9:45 AM    [facility] [address], [place], [place], [address] Phone [phone], Fax [phone]

## 2023-02-15 ENCOUNTER — Encounter: Payer: Self-pay | Admitting: Cardiology

## 2023-02-15 ENCOUNTER — Ambulatory Visit: Payer: PPO | Attending: Cardiology | Admitting: Cardiology

## 2023-02-15 VITALS — BP 100/50 | HR 74 | Ht 74.0 in | Wt 190.2 lb

## 2023-02-15 DIAGNOSIS — I447 Left bundle-branch block, unspecified: Secondary | ICD-10-CM | POA: Diagnosis not present

## 2023-02-15 DIAGNOSIS — I10 Essential (primary) hypertension: Secondary | ICD-10-CM

## 2023-02-15 DIAGNOSIS — E7800 Pure hypercholesterolemia, unspecified: Secondary | ICD-10-CM | POA: Diagnosis not present

## 2023-02-15 DIAGNOSIS — I420 Dilated cardiomyopathy: Secondary | ICD-10-CM

## 2023-02-15 MED ORDER — EMPAGLIFLOZIN 10 MG PO TABS: 10.0000 mg | ORAL_TABLET | Freq: Every day | ORAL | 3 refills | Status: DC

## 2023-02-15 NOTE — Patient Instructions (Signed)
Medication Instructions:  Start Jardiance 10mg  daily Stop Carvedilol  Continue all other medications *If you need a refill on your cardiac medications before your next appointment, please call your pharmacy*   Lab Work: none If you have labs (blood work) drawn today and your tests are completely normal, you will receive your results only by: MyChart Message (if you have MyChart) OR A paper copy in the mail If you have any lab test that is abnormal or we need to change your treatment, we will call you to review the results.   Testing/Procedures: none   Follow-Up: At [facility], you and your health needs are our priority.  As part of our continuing mission to provide you with exceptional heart care, we have created designated Provider Care Teams.  These Care Teams include your primary Cardiologist (physician) and Advanced Practice Providers (APPs -  Physician Assistants and Nurse Practitioners) who all work together to provide you with the care you need, when you need it.  We recommend signing up for the patient portal called "MyChart".  Sign up information is provided on this After Visit Summary.  MyChart is used to connect with patients for Virtual Visits (Telemedicine).  Patients are able to view lab/test results, encounter notes, upcoming appointments, etc.  Non-urgent messages can be sent to your provider as well.   To learn more about what you can do with MyChart, go to [email or web address].    Your next appointment:   6 month . Call office in Jan 2025 to make appt in April 2025  Provider:   Peter [place], MD

## 2023-02-18 DIAGNOSIS — E119 Type 2 diabetes mellitus without complications: Secondary | ICD-10-CM | POA: Diagnosis not present

## 2023-02-18 DIAGNOSIS — H26492 Other secondary cataract, left eye: Secondary | ICD-10-CM | POA: Diagnosis not present

## 2023-02-18 DIAGNOSIS — H18413 Arcus senilis, bilateral: Secondary | ICD-10-CM | POA: Diagnosis not present

## 2023-02-18 DIAGNOSIS — Z961 Presence of intraocular lens: Secondary | ICD-10-CM | POA: Diagnosis not present

## 2023-02-24 DIAGNOSIS — H26492 Other secondary cataract, left eye: Secondary | ICD-10-CM | POA: Diagnosis not present

## 2023-02-26 ENCOUNTER — Telehealth: Payer: Self-pay | Admitting: Cardiology

## 2023-02-26 NOTE — Telephone Encounter (Signed)
Pt c/o medication issue:  1. Name of Medication:  empagliflozin (JARDIANCE) 10 MG TABS tablet  2. How are you currently taking this medication (dosage and times per day)?   3. Are you having a reaction (difficulty breathing--STAT)?   4. What is your medication issue?   Patient states London Pepper has been prescribed but he never stopped Carvedilol. He plans to continue on continue on Carvedilol due to the price of Jardiance and the side effects. Please advise.

## 2023-02-26 NOTE — Telephone Encounter (Signed)
Spoke to patient . He states it will cost $430 for 30 day supply  for Jardiance  Patient states he did not stop taking Carvedilol  Patient was concerned  taking medication as well will drive his blood sugar numbers down to much.  He states he is diet control and does not have to take any diabetic medication at present time.   RN also informed  patient we have  team that can check to see why medication is so expensive. RN trie to explain that the Julianne Handler is not taking the place of him stopping the carvedilol   Patient states he is taking Entresto at present it is costing him over 400 as well due to being in the" [place]"   Aware will defer to Dr [place]

## 2023-03-01 ENCOUNTER — Other Ambulatory Visit (HOSPITAL_COMMUNITY): Payer: Self-pay

## 2023-03-08 NOTE — Telephone Encounter (Signed)
Medication is expensive because he is in the coverage cap and the Rx is for a 90 day supply. He has to pay 25% of drug cost until the end of the year and then everything resets. Same for his Entresto. He would be eligible for a healthwell grant to cover the copay for both medications if him and his wife make <$102,000/per year

## 2023-03-08 NOTE — Telephone Encounter (Signed)
Dr. [place]  Please advise  Patient does not qualify for financial assistance for Rx

## 2023-03-08 NOTE — Telephone Encounter (Signed)
Patient identification verified by 2 forms. Marilynn Rail, RN   Called and spoke to patient  Relayed message below  Patient states him and his wife make >$102,000/per year  Patient would like to know if there any alternative assistance or options?  Informed patient message sent  Patient has no further questions at this time

## 2023-03-08 NOTE — Telephone Encounter (Signed)
Would not qualify for any other pt assistance at that income

## 2023-03-28 ENCOUNTER — Other Ambulatory Visit: Payer: Self-pay | Admitting: Cardiology

## 2023-03-30 NOTE — Telephone Encounter (Signed)
Spoke to patient he stated he can afford Entresto.Stated he cannot afford Jardiance.Stated he will be able to afford next year.Stated his insurance plan is changing.He will call back for a new prescription.

## 2023-03-31 ENCOUNTER — Other Ambulatory Visit: Payer: Self-pay | Admitting: Cardiology

## 2023-03-31 MED ORDER — EZETIMIBE 10 MG PO TABS: 10.0000 mg | ORAL_TABLET | Freq: Every day | ORAL | 3 refills | Status: AC

## 2023-05-04 ENCOUNTER — Encounter: Payer: Self-pay | Admitting: Podiatry

## 2023-05-04 ENCOUNTER — Ambulatory Visit (INDEPENDENT_AMBULATORY_CARE_PROVIDER_SITE_OTHER): Payer: PPO | Admitting: Podiatry

## 2023-05-04 VITALS — Ht 74.0 in | Wt 187.0 lb

## 2023-05-04 DIAGNOSIS — L600 Ingrowing nail: Secondary | ICD-10-CM | POA: Diagnosis not present

## 2023-05-04 DIAGNOSIS — M2042 Other hammer toe(s) (acquired), left foot: Secondary | ICD-10-CM | POA: Diagnosis not present

## 2023-05-04 MED ORDER — NEOMYCIN-POLYMYXIN-HC 3.5-10000-1 OT SUSP
OTIC | 0 refills | Status: AC
Start: 2023-05-04 — End: ?

## 2023-05-04 NOTE — Patient Instructions (Signed)
 More silicone pads can be purchased from:  [email or web address]

## 2023-05-04 NOTE — Progress Notes (Signed)
  Subjective:  Patient ID: Jeffery Stone, male    DOB: [birth year],  MRN: [id number]  Chief Complaint  Patient presents with   Nail Problem    He has noticed the 2nd toe may be ingrown, and has a lot of pain x 2 months.     68 y.o. male presents with the above complaint. History confirmed with patient.  Also notes pain at the tip of the second and third toes  Objective:  Physical Exam: warm, good capillary refill, no trophic changes or ulcerative lesions, normal DP and PT pulses, normal sensory exam, and mallet toe deformity second and third toes left, ingrown medial border second toe no infection.  Assessment:   1. Ingrown toenail   2. Hammertoe of left foot      Plan:  Patient was evaluated and treated and all questions answered.  Discussed some of this is coming from his mallet toe contracture.  We discussed surgical and nonsurgical treatment.  I recommended beginning with nonsurgical treatment with offloading with silicone toe caps and toe crest.  We discussed surgical treatment if this is not improving.    Ingrown Nail, left -Patient elects to proceed with minor surgery to remove ingrown toenail today. Consent reviewed and signed by patient. -Ingrown nail excised. See procedure note. -Educated on post-procedure care including soaking. Written instructions provided and reviewed. -Rx for Cortisporin sent to pharmacy. -Advised on signs and symptoms of infection developing.  We discussed that the phenol likely will create some redness and edema and tenderness around the nailbed as long as it is localized this is to be expected.  Will return as needed if any infection signs develop  Procedure: Excision of Ingrown Toenail Location: Left second toe medial nail borders. Anesthesia: Lidocaine 1% plain; 1.5 mL and Marcaine 0.5% plain; 1.5 mL, digital block. Skin Prep: Betadine. Dressing: Silvadene; telfa; dry, sterile, compression dressing. Technique: Following skin prep, the toe was  exsanguinated and a tourniquet was secured at the base of the toe. The affected nail border was freed, split with a nail splitter, and excised. Chemical matrixectomy was then performed with phenol and irrigated out with alcohol. The tourniquet was then removed and sterile dressing applied. Disposition: Patient tolerated procedure well.    Return if symptoms worsen or fail to improve.

## 2023-05-26 ENCOUNTER — Telehealth: Payer: Self-pay | Admitting: Cardiology

## 2023-05-26 NOTE — Telephone Encounter (Signed)
*  STAT* If patient is at the pharmacy, call can be transferred to refill team.   1. Which medications need to be refilled? (please list name of each medication and dose if known) empagliflozin (JARDIANCE) 10 MG TABS tablet    2. Would you like to learn more about the convenience, safety, & potential cost savings by using the [facility]?     3. Are you open to using the [facility] (Type [facility]. ).   4. Which pharmacy/location (including street and city if local pharmacy) is medication to be sent to? [id number] - [place], [place] - [address]    5. Do they need a 30 day or 90 day supply? 30

## 2023-07-27 DIAGNOSIS — Z23 Encounter for immunization: Secondary | ICD-10-CM | POA: Diagnosis not present

## 2023-07-27 DIAGNOSIS — I420 Dilated cardiomyopathy: Secondary | ICD-10-CM | POA: Diagnosis not present

## 2023-07-27 DIAGNOSIS — Z0000 Encounter for general adult medical examination without abnormal findings: Secondary | ICD-10-CM | POA: Diagnosis not present

## 2023-07-27 DIAGNOSIS — E119 Type 2 diabetes mellitus without complications: Secondary | ICD-10-CM | POA: Diagnosis not present

## 2023-07-27 DIAGNOSIS — D649 Anemia, unspecified: Secondary | ICD-10-CM | POA: Diagnosis not present

## 2023-07-27 DIAGNOSIS — Z125 Encounter for screening for malignant neoplasm of prostate: Secondary | ICD-10-CM | POA: Diagnosis not present

## 2023-07-27 DIAGNOSIS — Z6824 Body mass index (BMI) 24.0-24.9, adult: Secondary | ICD-10-CM | POA: Diagnosis not present

## 2023-07-27 DIAGNOSIS — Z1211 Encounter for screening for malignant neoplasm of colon: Secondary | ICD-10-CM | POA: Diagnosis not present

## 2023-08-18 DIAGNOSIS — R3914 Feeling of incomplete bladder emptying: Secondary | ICD-10-CM | POA: Diagnosis not present

## 2023-08-18 DIAGNOSIS — N3941 Urge incontinence: Secondary | ICD-10-CM | POA: Diagnosis not present

## 2023-08-18 DIAGNOSIS — R350 Frequency of micturition: Secondary | ICD-10-CM | POA: Diagnosis not present

## 2023-08-18 DIAGNOSIS — N401 Enlarged prostate with lower urinary tract symptoms: Secondary | ICD-10-CM | POA: Diagnosis not present

## 2023-09-05 ENCOUNTER — Other Ambulatory Visit: Payer: Self-pay | Admitting: Cardiology

## 2023-09-10 NOTE — Progress Notes (Signed)
 Cardiology Office Note   Date:  09/14/2023   ID:  Jeffery Stone, DOB May 04, 1955, MRN [id number]  PCP:  [facility] [place]  Cardiologist:   Vielka Klinedinst [place], MD   No chief complaint on file.     History of Present Illness: Jeffery Stone is a 68 y.o. male who is seen for follow up of cardiomyopathy and chest pain. He has a history of DM type 2, HLD, HTN and LBBB. He has a history of OSA. Aortic atherosclerosis noted on CT.  Apparently has cardiomyopathy with EF 40-45%. Myoview  perfusion study in 2006 showed no ischemia.   Recently he was seen for a routine physical. Noted some symptoms of chest pain around his ribs R>L. Noted more when doing yard work. On physical labs showed an Elevated CPK to 484. He was sent to the ED where troponin levels were normal x 2. Ecg showed his chronic LBBB. He states his CPK went down to 216 but then back up again. Simvastatin held. Has been seen by Dr Jeoffrey Mole with no clear etiology found.   To further evaluate he had Myoview  study in April 2021 showing a fixed septal perfusion abnormality felt to be c/w LBBB artifact. EF 42%. Subsequent Echo showed EF 35-40%. Global HK. Recommended starting on Entresto . On his last visit he did note some positional dizziness and Entresto  dose was reduced.  Last year we recommended stopping Coreg  but he has continued 3.125 mg bid. He was not able to afford Jardiance  so didn't start it. Now on a new drug plan which is paying for his Entresto . He does note that he some chest pain in the cold weather. Notes if he works hard one day it takes him a full day to recover. Gets SOB walking 4 flights of stairs. Concerned that his brother in law had to have CABG.    Past Medical History:  Diagnosis Date   Diabetes mellitus    type 2   Hypercholesterolemia    Hypertension    LBBB (left bundle branch block)    Nonischemic dilated cardiomyopathy (HCC)    Est. EF of 40-45%   Sleep apnea     Past Surgical History:   Procedure Laterality Date   BACK SURGERY       Current Outpatient Medications  Medication Sig Dispense Refill   aspirin EC 81 MG tablet Take 81 mg by mouth daily.     Bayer Microlet Lancets lancets USE TO TEST BLOOD SUGAR ONCE DAILY E11.9     Cholecalciferol (VITAMIN D3) 25 MCG (1000 UT) CAPS Take 1,000 Units by mouth daily.     empagliflozin  (JARDIANCE ) 10 MG TABS tablet Take 1 tablet (10 mg total) by mouth daily before breakfast. 90 tablet 3   ezetimibe  (ZETIA ) 10 MG tablet Take 1 tablet (10 mg total) by mouth daily. 90 tablet 3   fenofibrate (TRICOR) 145 MG tablet Take 145 mg by mouth daily.       Ferrous Sulfate (IRON) 325 (65 Fe) MG TABS 1 tablet     FEXOFENADINE HCL PO Take by mouth.     Melatonin 10 MG CHEW Chew 10 mg by mouth at bedtime.     multivitamin (THERAGRAN) per tablet Take 1 tablet by mouth daily.       neomycin -polymyxin-hydrocortisone (CORTISPORIN) 3.5-10000-1 OTIC suspension Apply 1-2 drops daily after soaking and cover with bandaid 10 mL 0   ONETOUCH ULTRA test strip daily.     pantoprazole  (PROTONIX ) 40 MG tablet Take 1  tablet (40 mg total) by mouth as needed (For Indigestion). 30 tablet 2   sacubitril -valsartan  (ENTRESTO ) 24-26 MG Take 1 tablet by mouth 2 (two) times daily. 180 tablet 3   sildenafil (VIAGRA) 100 MG tablet      Triprolidine-Pseudoephedrine (ANTIHISTAMINE PO) Take by mouth daily.     alfuzosin (UROXATRAL) 10 MG 24 hr tablet Take 10 mg by mouth daily.     nitroGLYCERIN  (NITROSTAT ) 0.4 MG SL tablet Place 1 tablet (0.4 mg total) under the tongue every 5 (five) minutes as needed for chest pain. 25 tablet 1   No current facility-administered medications for this visit.    Allergies:   Poison ivy extract and Simvastatin    Social History:  The patient  reports that he has never smoked. He has never been exposed to tobacco smoke. He has never used smokeless tobacco. He reports that he does not drink alcohol and does not use drugs.   Family History:   The patient's family history includes Cancer in his father; Dementia in his mother; Diabetes in his father; Healthy in his son and son; Heart attack in his brother; Hypertension in his father and mother; Liver cancer in his mother; Stroke in his mother; Transient ischemic attack in his mother.    ROS:  Please see the history of present illness.   Otherwise, review of systems are positive for none.   All other systems are reviewed and negative.    PHYSICAL EXAM: VS:  BP 114/82   Pulse 69   Ht 6\' 2"  (1.88 m)   Wt 191 lb 9.6 oz (86.9 kg)   SpO2 97%   BMI 24.60 kg/m  , BMI Body mass index is 24.6 kg/m. GEN: Well nourished, well developed, in no acute distress  HEENT: normal  Neck: no JVD, carotid bruits, or masses Cardiac: RRR; no murmurs, rubs, or gallops,no edema  Respiratory:  clear to auscultation bilaterally, normal work of breathing GI: soft, nontender, nondistended, + BS MS: no deformity or atrophy  Skin: warm and dry, multiple chigger bites on LE Neuro:  Strength and sensation are intact Psych: euthymic mood, full affect   EKG Interpretation Date/Time:  Tuesday Sep 14 2023 13:19:36 EDT Ventricular Rate:  61 PR Interval:  134 QRS Duration:  154 QT Interval:  464 QTC Calculation: 467 R Axis:   -13  Text Interpretation: Normal sinus rhythm Left bundle branch block When compared with ECG of May 16,2024 NO SIGNIFICANT CHANGE SINCE LAST TRACING YESTERDAY Confirmed by [place], Delonna Ney [phone]) on 09/14/2023 1:25:45 PM    Recent Labs: No results found for requested labs within last 365 days.    Lipid Panel    Component Value Date/Time   CHOL 157 09/10/2022 1026   TRIG 97 09/10/2022 1026   HDL 32 (L) 09/10/2022 1026   CHOLHDL 4.9 09/10/2022 1026   LDLCALC 107 (H) 09/10/2022 1026     Dated 07/06/19: cholesterol 136, triglycerides 81, HDL 32, LDL 71. A1c 6%. Creatinine 1.2.. Hgb 12. Otherwise CMET and CBC normal.  Dated 07/16/21: cholesterol 161, triglycerides 125, HDL 32, LDL  106. A1c 6%. CMET normal Dated 01/18/23: cholesterol 157, triglycerides 97, HDL 32, LDL 107. A1c 6%. Hgb 11.9. otherwise CBC and CMET normal Dated 07/27/23: A1c 6.2%.    Wt Readings from Last 3 Encounters:  09/14/23 191 lb 9.6 oz (86.9 kg)  05/04/23 187 lb (84.8 kg)  02/15/23 190 lb 3.2 oz (86.3 kg)      Other studies Reviewed: Additional studies/ records that were  reviewed today include:   Myoview  08/16/19: Study Highlights    The left ventricular ejection fraction is moderately decreased (30-44%). Nuclear stress EF: 42%. There was no ST segment deviation noted during stress. Defect 1: There is a large defect of mild severity present in the mid anteroseptal, mid inferoseptal and apical septal location. This is an intermediate risk study.   Intermediate risk stress nuclear study due to moderately reduced left ventricular systolic function. There is a fixed septal perfusion defect consistent with LBBB-related artifact. No reversible ischemia is seen.    Echo 08/21/19: IMPRESSIONS     1. Left ventricular ejection fraction, by estimation, is 35 to 40%. The  left ventricle has moderately decreased function. The left ventricle  demonstrates global hypokinesis. The left ventricular internal cavity size  was mildly dilated. Left ventricular  diastolic parameters are indeterminate.   2. Right ventricular systolic function is normal. The right ventricular  size is normal.   3. The mitral valve is normal in structure. Mild mitral valve  regurgitation.   4. The aortic valve is normal in structure. Aortic valve regurgitation is  not visualized.    ASSESSMENT AND PLAN:  1. History of nonischemic CM with EF 40-45%. Currently class 1. I suspect this is at least partly related to dyssynchrony with his LBBB. Also may be related to HTN and DM. Now EF 42% by Myoview  and 35-40% by Echo. On Entresto . Recommend stopping  Coreg  due to lightheadedness and low BP. Will again see if Jardiance  is  covered by his new drug plan. He did have a Myoview  in 2021 without perfusion abnormality. Given recent concerns will schedule for coronary CTA  3. Elevated CPK. Not cardiac. Now off statin. Seen by Rheumatology and evaluation negative.  4. LBBB- chronic 5. DM type 2 6. HTN- tendency for low BP - will hold Coreg  for now.  7. HLD off statin due to elevated CPK. On fenofibrate and Zetia . Will update. If CT shows CAD will need to consider alternative lipid lowering therapy 8. Aortic atherosclerosis. Noted on CT.    Current medicines are reviewed at length with the patient today.  The patient has no concerns regarding medicines.  The following changes have been made:  no change  Labs/ tests ordered today include:   Orders Placed This Encounter  Procedures   EKG 12-Lead     Disposition:   FU TBD  Signed, Ethylene Reznick [place], MD  09/14/2023 1:25 PM    [facility] [address] [place], [place], [address] Phone [phone], Fax [phone]

## 2023-09-14 ENCOUNTER — Encounter: Payer: Self-pay | Admitting: Cardiology

## 2023-09-14 ENCOUNTER — Ambulatory Visit: Payer: PPO | Attending: Cardiology | Admitting: Cardiology

## 2023-09-14 VITALS — BP 114/82 | HR 69 | Ht 74.0 in | Wt 191.6 lb

## 2023-09-14 DIAGNOSIS — E782 Mixed hyperlipidemia: Secondary | ICD-10-CM

## 2023-09-14 DIAGNOSIS — I10 Essential (primary) hypertension: Secondary | ICD-10-CM | POA: Diagnosis not present

## 2023-09-14 DIAGNOSIS — R072 Precordial pain: Secondary | ICD-10-CM

## 2023-09-14 MED ORDER — EMPAGLIFLOZIN 10 MG PO TABS: 10.0000 mg | ORAL_TABLET | Freq: Every day | ORAL | 3 refills | Status: AC

## 2023-09-14 MED ORDER — METOPROLOL TARTRATE 100 MG PO TABS: ORAL_TABLET | ORAL | 0 refills | Status: AC

## 2023-09-14 NOTE — Patient Instructions (Addendum)
 Medication Instructions:  Start Jardiance  10 mg every morning before breakfast  Hold Carvedilol   Continue all other medications *If you need a refill on your cardiac medications before your next appointment, please call your pharmacy*  Lab Work: Bmet,lipid panel today  Testing/Procedures: Coronary CT will be scheduled after approved by insurance  Follow instructions below   Follow-Up: At [facility], you and your health needs are our priority.  As part of our continuing mission to provide you with exceptional heart care, our providers are all part of one team.  This team includes your primary Cardiologist (physician) and Advanced Practice Providers or APPs (Physician Assistants and Nurse Practitioners) who all work together to provide you with the care you need, when you need it.  Your next appointment:  To be determined after test    Provider:  Dr.Jordan     Your cardiac CT will be scheduled at one of the below locations:   [facility] [address] [place], [place] [address] [phone]  OR  [facility] [address] [place], [place] [address] [phone]  OR   [facility] [address] [place], [place] [address] [phone]  OR   [facility] [address] [place], [place] [address] [phone]  OR   [facility] [address]  [place], [place] [address] Opening August 23, 2023  If scheduled at [facility], please arrive at the [facility] and Children's Entrance (Entrance C2) of [facility] 30 minutes prior to test start time. You can use the FREE valet parking offered at entrance C (encouraged to control the heart rate for the test)  Proceed to the [facility] Radiology Department (first floor) to check-in and test prep.   All radiology patients and guests should use entrance C2 at [facility], accessed from [facility], even though the hospital's physical address listed is [address].    If scheduled at the Heart and Vascular Tower at [address], please enter the parking lot using the [address] entrance and use the FREE valet service at the patient drop-off area. Enter the buidling and check-in with registration on the main floor.  If scheduled at [facility] or [facility], please arrive 15 mins early for check-in and test prep.  There is spacious parking and easy access to the radiology department from the [facility] V[facility] entrance. Please enter here and check-in with the desk attendant.   If scheduled at [facility], please arrive 30 minutes early for check-in and test prep.  Please follow these instructions carefully (unless otherwise directed):  An IV will be required for this test and Nitroglycerin  will be given.  Hold all erectile dysfunction medications at least 3 days (72 hrs) prior to test. (Ie viagra, cialis, sildenafil, tadalafil, etc)   On the Night Before the Test: Be sure to Drink plenty of water. Do not consume any caffeinated/decaffeinated beverages or chocolate 12 hours prior to your test. Do not take any antihistamines 12 hours prior to your test.   On the Day of the Test: Drink plenty of water until 1 hour prior to the test. Do not eat any food 1 hour prior to test. You may take your regular medications prior to the test.  Take metoprolol 100 mg two hours prior to test. If you take Furosemide/Hydrochlorothiazide/Spironolactone/Chlorthalidone, please HOLD on  the morning of the test. Patients who wear a continuous glucose monitor MUST remove the device prior to scanning.       After the Test: Drink plenty of water. After receiving IV contrast, you may experience a mild flushed feeling. This is normal. On occasion, you may experience a mild rash up  to 24 hours after the test. This is not dangerous. If this occurs, you can take Benadryl 25 mg, Zyrtec, Claritin, or Allegra and increase your fluid intake. (Patients taking Tikosyn should avoid Benadryl, and may take Zyrtec, Claritin, or Allegra) If you experience trouble breathing, this can be serious. If it is severe call [phone] IMMEDIATELY. If it is mild, please call our office.  We will call to schedule your test 2-4 weeks out understanding that some insurance companies will need an authorization prior to the service being performed.   For more information and frequently asked questions, please visit our website : [email or web address]  For non-scheduling related questions, please contact the cardiac imaging nurse navigator should you have any questions/concerns: Cardiac Imaging Nurse Navigators Direct Office Dial: [phone]   For scheduling needs, including cancellations and rescheduling, please call [place], [phone].   We recommend signing up for the patient portal called "MyChart".  Sign up information is provided on this After Visit Summary.  MyChart is used to connect with patients for Virtual Visits (Telemedicine).  Patients are able to view lab/test results, encounter notes, upcoming appointments, etc.  Non-urgent messages can be sent to your provider as well.   To learn more about what you can do with MyChart, go to [email or web address].

## 2023-09-15 ENCOUNTER — Ambulatory Visit: Payer: Self-pay | Admitting: Cardiology

## 2023-09-15 LAB — BASIC METABOLIC PANEL WITH GFR
BUN/Creatinine Ratio: 24 (ref 10–24)
BUN: 26 mg/dL (ref 8–27)
CO2: 22 mmol/L (ref 20–29)
Calcium: 9.6 mg/dL (ref 8.6–10.2)
Chloride: 104 mmol/L (ref 96–106)
Creatinine, Ser: 1.07 mg/dL (ref 0.76–1.27)
Glucose: 84 mg/dL (ref 70–99)
Potassium: 4.4 mmol/L (ref 3.5–5.2)
Sodium: 141 mmol/L (ref 134–144)
eGFR: 75 mL/min/{1.73_m2} (ref >59)

## 2023-09-15 LAB — LIPID PANEL
Chol/HDL Ratio: 5.5 ratio — ABNORMAL HIGH (ref 0.0–5.0)
Cholesterol, Total: 164 mg/dL (ref 100–199)
HDL: 30 mg/dL — ABNORMAL LOW (ref >39)
LDL Chol Calc (NIH): 114 mg/dL — ABNORMAL HIGH (ref 0–99)
Triglycerides: 109 mg/dL (ref 0–149)
VLDL Cholesterol Cal: 20 mg/dL (ref 5–40)

## 2023-09-16 ENCOUNTER — Other Ambulatory Visit: Payer: Self-pay

## 2023-09-16 DIAGNOSIS — K648 Other hemorrhoids: Secondary | ICD-10-CM | POA: Diagnosis not present

## 2023-09-16 DIAGNOSIS — E782 Mixed hyperlipidemia: Secondary | ICD-10-CM

## 2023-09-16 DIAGNOSIS — Z1211 Encounter for screening for malignant neoplasm of colon: Secondary | ICD-10-CM | POA: Diagnosis not present

## 2023-09-16 DIAGNOSIS — E7800 Pure hypercholesterolemia, unspecified: Secondary | ICD-10-CM

## 2023-09-28 ENCOUNTER — Encounter (HOSPITAL_COMMUNITY): Payer: Self-pay

## 2023-09-29 ENCOUNTER — Telehealth (HOSPITAL_COMMUNITY): Payer: Self-pay | Admitting: *Deleted

## 2023-09-29 NOTE — Telephone Encounter (Signed)
 Reaching out to patient to offer assistance regarding upcoming cardiac imaging study; pt verbalizes understanding of appt date/time, parking situation and where to check in, pre-test NPO status and medications ordered, and verified current allergies; name and call back number provided for further questions should they arise  Larey Brick RN Navigator Cardiac Imaging [facility] [phone] office [phone] cell  Patient to take 50mg  metoprolol tartrate two hours prior to his cardiac CT scan.

## 2023-09-30 ENCOUNTER — Ambulatory Visit (HOSPITAL_COMMUNITY)
Admission: RE | Admit: 2023-09-30 | Discharge: 2023-09-30 | Disposition: A | Discharge status: Home / Self Care | Source: Ambulatory Visit | Attending: Cardiology | Admitting: Cardiology

## 2023-09-30 DIAGNOSIS — R072 Precordial pain: Secondary | ICD-10-CM | POA: Diagnosis not present

## 2023-09-30 DIAGNOSIS — I2510 Atherosclerotic heart disease of native coronary artery without angina pectoris: Secondary | ICD-10-CM

## 2023-09-30 DIAGNOSIS — R931 Abnormal findings on diagnostic imaging of heart and coronary circulation: Secondary | ICD-10-CM | POA: Insufficient documentation

## 2023-09-30 MED ORDER — IOHEXOL 350 MG/ML SOLN
100.0000 mL | Freq: Once | INTRAVENOUS | Status: AC | PRN
  Administered 2023-09-30: 100 mL via INTRAVENOUS

## 2023-09-30 MED ORDER — NITROGLYCERIN 0.4 MG SL SUBL
0.8000 mg | SUBLINGUAL_TABLET | Freq: Once | SUBLINGUAL | Status: AC
  Administered 2023-09-30: 0.8 mg via SUBLINGUAL

## 2023-10-01 ENCOUNTER — Other Ambulatory Visit: Payer: Self-pay | Admitting: Cardiology

## 2023-10-01 ENCOUNTER — Ambulatory Visit (HOSPITAL_COMMUNITY)
Admission: RE | Admit: 2023-10-01 | Discharge: 2023-10-01 | Disposition: A | Discharge status: Home / Self Care | Source: Ambulatory Visit | Attending: Cardiology | Admitting: Cardiology

## 2023-10-01 DIAGNOSIS — R072 Precordial pain: Secondary | ICD-10-CM

## 2023-10-01 DIAGNOSIS — R931 Abnormal findings on diagnostic imaging of heart and coronary circulation: Secondary | ICD-10-CM

## 2023-10-11 ENCOUNTER — Telehealth: Payer: Self-pay

## 2023-10-11 NOTE — Telephone Encounter (Signed)
 Spoke to patient Dr.Jordan advised follow up appointment in 1 year.

## 2023-10-18 ENCOUNTER — Telehealth: Payer: Self-pay

## 2023-10-18 NOTE — Telephone Encounter (Signed)
 Called patient left message on personal voice mail your next appointment with Dr.Jordan in 1 year 09/2024.Advised to call sooner if needed.

## 2023-10-21 DIAGNOSIS — M545 Low back pain, unspecified: Secondary | ICD-10-CM | POA: Diagnosis not present

## 2023-11-02 ENCOUNTER — Other Ambulatory Visit: Payer: Self-pay | Admitting: Student

## 2023-11-09 ENCOUNTER — Telehealth: Payer: Self-pay | Admitting: Pharmacist

## 2023-11-09 ENCOUNTER — Ambulatory Visit: Attending: Cardiology | Admitting: Pharmacist

## 2023-11-09 ENCOUNTER — Encounter: Payer: Self-pay | Admitting: Pharmacist

## 2023-11-09 DIAGNOSIS — B351 Tinea unguium: Secondary | ICD-10-CM | POA: Insufficient documentation

## 2023-11-09 DIAGNOSIS — E7800 Pure hypercholesterolemia, unspecified: Secondary | ICD-10-CM | POA: Diagnosis not present

## 2023-11-09 DIAGNOSIS — N183 Chronic kidney disease, stage 3 unspecified: Secondary | ICD-10-CM | POA: Insufficient documentation

## 2023-11-09 DIAGNOSIS — M791 Myalgia, unspecified site: Secondary | ICD-10-CM | POA: Insufficient documentation

## 2023-11-09 DIAGNOSIS — I700 Atherosclerosis of aorta: Secondary | ICD-10-CM | POA: Insufficient documentation

## 2023-11-09 DIAGNOSIS — E559 Vitamin D deficiency, unspecified: Secondary | ICD-10-CM | POA: Insufficient documentation

## 2023-11-09 DIAGNOSIS — R32 Unspecified urinary incontinence: Secondary | ICD-10-CM | POA: Insufficient documentation

## 2023-11-09 DIAGNOSIS — G4733 Obstructive sleep apnea (adult) (pediatric): Secondary | ICD-10-CM | POA: Insufficient documentation

## 2023-11-09 DIAGNOSIS — N529 Male erectile dysfunction, unspecified: Secondary | ICD-10-CM | POA: Insufficient documentation

## 2023-11-09 DIAGNOSIS — R931 Abnormal findings on diagnostic imaging of heart and coronary circulation: Secondary | ICD-10-CM

## 2023-11-09 DIAGNOSIS — K760 Fatty (change of) liver, not elsewhere classified: Secondary | ICD-10-CM | POA: Insufficient documentation

## 2023-11-09 DIAGNOSIS — R7989 Other specified abnormal findings of blood chemistry: Secondary | ICD-10-CM | POA: Insufficient documentation

## 2023-11-09 DIAGNOSIS — T466X5D Adverse effect of antihyperlipidemic and antiarteriosclerotic drugs, subsequent encounter: Secondary | ICD-10-CM

## 2023-11-09 DIAGNOSIS — D649 Anemia, unspecified: Secondary | ICD-10-CM | POA: Insufficient documentation

## 2023-11-09 DIAGNOSIS — Z8051 Family history of malignant neoplasm of kidney: Secondary | ICD-10-CM | POA: Insufficient documentation

## 2023-11-09 NOTE — Telephone Encounter (Signed)
 Please complete PA for Repatha/Praluent

## 2023-11-09 NOTE — Patient Instructions (Addendum)
 It was nice meeting you today  We would like your LDL (bad cholesterol) to be less than 70  Please continue your ezetimibe  10mg  once a day  The medication we spoke about today is called Repatha , which is an injection you would take once every 2 weeks  I will complete the prior authorization and contact you with the result  After you start the medication, we will recheck your fasting lipid panel in about 3 months  Let us  know if you have any questions  Medford Bolk, PharmD, BCACP, CDCES, CPP [facility] [address] [place], [place] [address] Phone: [phone]; Fax: [phone] 11/09/2023 3:08 PM

## 2023-11-09 NOTE — Progress Notes (Signed)
 Patient ID: Jeffery Stone                 DOB: 12-23-1955                    MRN: [id number]     HPI: Jeffery Stone is a 68 y.o. male patient referred to lipid clinic by Dr [place]. PMH is significant for LBBB, HTN, aortic atherosclerosis, OSA, CKD, elevated coronary calcium score, and T2DM. Patient is statin intolerant.  Patient presents today to discuss lipid management. Currently on ezetimibe   10mg  once daily with no reported adverse effects.  Is intolerant to statin due to myalgias which would wake him up at night typically on his left leg. Records indicate he was previously on simvastatin, does not recall the names of others. Reports myalgias ceased quickly once he discontinued medication.   Current Medications: Ezetimibe  10mg  daily  Intolerances:  Statins  Risk Factors:  CAD T2DM Elevated Coronary Calcium score  LDL goal: <70  Labs: TC 164, Trigs 109,. HDL 30, LDL 114 (09/14/23)  Imaging:   MPRESSION: 1. Coronary calcium score is 253, which places the patient in the 62nd percentile for age and sex matched control.   2. Total plaque volume (TPV) is 243 mm3, categorized as moderate, which is 25th percentile for age- and sex-matched controls.   3. Normal coronary origins with left dominance.   4. CAD-RADS 3 Moderate coronary artery disease.   5. Moderate stenosis (50-69%) due to calcified plaque at proximal LAD. Mild stenosis (25-49%) due to calcified plaque at proxima/mid LAD.  Past Medical History:  Diagnosis Date   Diabetes mellitus    type 2   Hypercholesterolemia    Hypertension    LBBB (left bundle branch block)    Nonischemic dilated cardiomyopathy (HCC)    Est. EF of 40-45%   Sleep apnea     Current Outpatient Medications on File Prior to Visit  Medication Sig Dispense Refill   alfuzosin (UROXATRAL) 10 MG 24 hr tablet Take 10 mg by mouth daily.     aspirin EC 81 MG tablet Take 81 mg by mouth daily.     Bayer Microlet Lancets lancets USE TO TEST BLOOD SUGAR  ONCE DAILY E11.9     Cholecalciferol (VITAMIN D3) 25 MCG (1000 UT) CAPS Take 1,000 Units by mouth daily.     empagliflozin  (JARDIANCE ) 10 MG TABS tablet Take 1 tablet (10 mg total) by mouth daily before breakfast. 90 tablet 3   ENTRESTO  24-26 MG TAKE 1 TABLET BY MOUTH TWICE A DAY 180 tablet 3   ezetimibe  (ZETIA ) 10 MG tablet Take 1 tablet (10 mg total) by mouth daily. 90 tablet 3   fenofibrate (TRICOR) 145 MG tablet Take 145 mg by mouth daily.       Ferrous Sulfate (IRON) 325 (65 Fe) MG TABS 1 tablet     FEXOFENADINE HCL PO Take by mouth.     Melatonin 10 MG CHEW Chew 10 mg by mouth at bedtime.     metoprolol  tartrate (LOPRESSOR ) 100 MG tablet Take 100 mg 2 hours before Coronary CT 1 tablet 0   multivitamin (THERAGRAN) per tablet Take 1 tablet by mouth daily.       neomycin -polymyxin-hydrocortisone (CORTISPORIN) 3.5-10000-1 OTIC suspension Apply 1-2 drops daily after soaking and cover with bandaid 10 mL 0   nitroGLYCERIN  (NITROSTAT ) 0.4 MG SL tablet Place 1 tablet (0.4 mg total) under the tongue every 5 (five) minutes as needed for chest pain. 25 tablet 1  ONETOUCH ULTRA test strip daily.     pantoprazole  (PROTONIX ) 40 MG tablet Take 1 tablet (40 mg total) by mouth as needed (For Indigestion). 30 tablet 2   sildenafil (VIAGRA) 100 MG tablet      Triprolidine-Pseudoephedrine (ANTIHISTAMINE PO) Take by mouth daily.     No current facility-administered medications on file prior to visit.    Allergies  Allergen Reactions   Poison Ivy Extract    Simvastatin     Other reaction(s): muscle pain, elevated CPK    Assessment/Plan:  1. Hyperlipidemia - Patient last LDL is above goal of <70. Since patient intolerant to statins, recommend addition of PCSK9i.  Using demo pen, educated patient on mechanism of action, storage, site selection, administration, and possible adverse effects. Will complete PA and contact patient with result. Recheck lipid panel in 3 months,  Continue Zetia  10mg  once  daily Start Repatha /Praluent q 14 days Recheck lipid panel in 3 months  Medford Bolk, PharmD, BCACP, CDCES, CPP [facility] [address], [place], [place] [address] Phone: [phone]; Fax: [phone] 11/09/2023 5:12 PM

## 2023-11-10 ENCOUNTER — Telehealth: Payer: Self-pay | Admitting: Pharmacy Technician

## 2023-11-10 ENCOUNTER — Other Ambulatory Visit (HOSPITAL_BASED_OUTPATIENT_CLINIC_OR_DEPARTMENT_OTHER): Payer: Self-pay

## 2023-11-10 ENCOUNTER — Other Ambulatory Visit (HOSPITAL_COMMUNITY): Payer: Self-pay

## 2023-11-10 MED ORDER — REPATHA SURECLICK 140 MG/ML ~~LOC~~ SOAJ
1.0000 mL | SUBCUTANEOUS | 1 refills | Status: AC
  Filled 2023-11-10: qty 6, 84d supply, fill #0
  Filled 2024-01-26: qty 6, 84d supply, fill #1

## 2023-11-10 NOTE — Telephone Encounter (Signed)
   Pharmacy Patient Advocate Encounter   Received notification from Pt Calls Messages-chris that prior authorization for Repatha  is required/requested.   Insurance verification completed.   The patient is insured through [facility] ADVANTAGE/RX ADVANCE .   Per test claim: PA required; PA submitted to above mentioned insurance via latent Key/confirmation #/EOC [id number] Status is pending    Pharmacy Patient Advocate Encounter  Received notification from [employer] that Prior Authorization for repatha  140mg  has been APPROVED from 11/10/23 to 05/08/24. Ran test claim, Copay is $0.00. This test claim was processed through [facility]- copay amounts may vary at other pharmacies due to pharmacy/plan contracts, or as the patient moves through the different stages of their insurance plan.   PA #/Case ID/Reference #: [id number]

## 2023-11-15 DIAGNOSIS — M47816 Spondylosis without myelopathy or radiculopathy, lumbar region: Secondary | ICD-10-CM | POA: Diagnosis not present

## 2023-11-15 DIAGNOSIS — Z7984 Long term (current) use of oral hypoglycemic drugs: Secondary | ICD-10-CM | POA: Diagnosis not present

## 2023-11-15 DIAGNOSIS — Z961 Presence of intraocular lens: Secondary | ICD-10-CM | POA: Diagnosis not present

## 2023-11-24 DIAGNOSIS — M545 Low back pain, unspecified: Secondary | ICD-10-CM | POA: Diagnosis not present

## 2023-11-24 DIAGNOSIS — G8929 Other chronic pain: Secondary | ICD-10-CM | POA: Diagnosis not present

## 2023-11-30 DIAGNOSIS — M545 Low back pain, unspecified: Secondary | ICD-10-CM | POA: Diagnosis not present

## 2023-11-30 DIAGNOSIS — G8929 Other chronic pain: Secondary | ICD-10-CM | POA: Diagnosis not present

## 2023-12-06 DIAGNOSIS — G8929 Other chronic pain: Secondary | ICD-10-CM | POA: Diagnosis not present

## 2023-12-06 DIAGNOSIS — M545 Low back pain, unspecified: Secondary | ICD-10-CM | POA: Diagnosis not present

## 2023-12-30 DIAGNOSIS — M545 Low back pain, unspecified: Secondary | ICD-10-CM | POA: Diagnosis not present

## 2023-12-30 DIAGNOSIS — G8929 Other chronic pain: Secondary | ICD-10-CM | POA: Diagnosis not present

## 2024-01-07 DIAGNOSIS — B078 Other viral warts: Secondary | ICD-10-CM | POA: Diagnosis not present

## 2024-01-07 DIAGNOSIS — D225 Melanocytic nevi of trunk: Secondary | ICD-10-CM | POA: Diagnosis not present

## 2024-01-07 DIAGNOSIS — L578 Other skin changes due to chronic exposure to nonionizing radiation: Secondary | ICD-10-CM | POA: Diagnosis not present

## 2024-01-07 DIAGNOSIS — Z1283 Encounter for screening for malignant neoplasm of skin: Secondary | ICD-10-CM | POA: Diagnosis not present

## 2024-01-26 ENCOUNTER — Other Ambulatory Visit (HOSPITAL_BASED_OUTPATIENT_CLINIC_OR_DEPARTMENT_OTHER): Payer: Self-pay

## 2024-02-04 DIAGNOSIS — R799 Abnormal finding of blood chemistry, unspecified: Secondary | ICD-10-CM | POA: Diagnosis not present

## 2024-02-04 DIAGNOSIS — E7800 Pure hypercholesterolemia, unspecified: Secondary | ICD-10-CM | POA: Diagnosis not present

## 2024-02-04 DIAGNOSIS — E119 Type 2 diabetes mellitus without complications: Secondary | ICD-10-CM | POA: Diagnosis not present

## 2024-02-04 DIAGNOSIS — I420 Dilated cardiomyopathy: Secondary | ICD-10-CM | POA: Diagnosis not present

## 2024-02-04 DIAGNOSIS — T466X5A Adverse effect of antihyperlipidemic and antiarteriosclerotic drugs, initial encounter: Secondary | ICD-10-CM | POA: Diagnosis not present

## 2024-04-06 NOTE — Telephone Encounter (Signed)
 I called the patient and let him know that his Repatha  PA is approved through 05/08/24 and I can forward this to our pharmacy team for next steps on how to get it approved through insurance for the upcoming year. He was appreciative of the call and said he will reach out if needed.

## 2024-04-06 NOTE — Telephone Encounter (Signed)
 Pt called to f/u to see if 2026 PA is going to be done by1/26 please advise

## 2024-04-13 ENCOUNTER — Other Ambulatory Visit (HOSPITAL_BASED_OUTPATIENT_CLINIC_OR_DEPARTMENT_OTHER): Payer: Self-pay

## 2024-04-13 ENCOUNTER — Other Ambulatory Visit: Payer: Self-pay | Admitting: Cardiology

## 2024-04-13 DIAGNOSIS — R931 Abnormal findings on diagnostic imaging of heart and coronary circulation: Secondary | ICD-10-CM

## 2024-04-13 DIAGNOSIS — E7800 Pure hypercholesterolemia, unspecified: Secondary | ICD-10-CM

## 2024-04-14 ENCOUNTER — Other Ambulatory Visit (HOSPITAL_BASED_OUTPATIENT_CLINIC_OR_DEPARTMENT_OTHER): Payer: Self-pay

## 2024-04-14 MED ORDER — REPATHA SURECLICK 140 MG/ML ~~LOC~~ SOAJ
1.0000 mL | SUBCUTANEOUS | 1 refills | Status: AC
  Filled 2024-04-14: qty 6, 84d supply, fill #0
